# Patient Record
Sex: Female | Born: 1940 | Race: Black or African American | Hispanic: No | Smoking: Former smoker
Health system: Southern US, Community
[De-identification: ages and names within clinical notes are randomized; demographics above are authoritative.]

## PROBLEM LIST (undated history)

## (undated) DIAGNOSIS — E7800 Pure hypercholesterolemia, unspecified: Secondary | ICD-10-CM

## (undated) DIAGNOSIS — R42 Dizziness and giddiness: Secondary | ICD-10-CM

## (undated) DIAGNOSIS — I10 Essential (primary) hypertension: Secondary | ICD-10-CM

## (undated) DIAGNOSIS — Z972 Presence of dental prosthetic device (complete) (partial): Secondary | ICD-10-CM

## (undated) DIAGNOSIS — G459 Transient cerebral ischemic attack, unspecified: Secondary | ICD-10-CM

## (undated) DIAGNOSIS — Z9049 Acquired absence of other specified parts of digestive tract: Secondary | ICD-10-CM

## (undated) DIAGNOSIS — J449 Chronic obstructive pulmonary disease, unspecified: Secondary | ICD-10-CM

## (undated) HISTORY — PX: TONSILLECTOMY: SUR1361

## (undated) HISTORY — PX: BACK SURGERY: SHX140

## (undated) HISTORY — PX: ABDOMINAL HYSTERECTOMY: SHX81

## (undated) HISTORY — PX: TUBAL LIGATION: SHX77

---

## 2006-09-22 ENCOUNTER — Ambulatory Visit: Payer: Self-pay | Admitting: Emergency Medicine

## 2007-01-30 ENCOUNTER — Emergency Department: Payer: Self-pay | Admitting: Emergency Medicine

## 2007-02-01 ENCOUNTER — Ambulatory Visit: Payer: Self-pay | Admitting: Emergency Medicine

## 2007-02-11 ENCOUNTER — Ambulatory Visit: Payer: Self-pay | Admitting: Internal Medicine

## 2007-03-18 ENCOUNTER — Ambulatory Visit: Payer: Self-pay | Admitting: Internal Medicine

## 2008-10-23 ENCOUNTER — Ambulatory Visit: Payer: Self-pay | Admitting: Family Medicine

## 2008-12-11 ENCOUNTER — Ambulatory Visit: Payer: Self-pay | Admitting: Internal Medicine

## 2009-01-11 ENCOUNTER — Ambulatory Visit: Payer: Self-pay | Admitting: Family Medicine

## 2009-04-09 ENCOUNTER — Ambulatory Visit: Payer: Self-pay | Admitting: Internal Medicine

## 2010-05-05 ENCOUNTER — Ambulatory Visit: Payer: Self-pay | Admitting: Internal Medicine

## 2010-08-06 DIAGNOSIS — I6350 Cerebral infarction due to unspecified occlusion or stenosis of unspecified cerebral artery: Secondary | ICD-10-CM | POA: Insufficient documentation

## 2010-08-08 DIAGNOSIS — I5033 Acute on chronic diastolic (congestive) heart failure: Secondary | ICD-10-CM | POA: Insufficient documentation

## 2010-08-25 ENCOUNTER — Ambulatory Visit: Payer: Self-pay | Admitting: Internal Medicine

## 2010-09-16 ENCOUNTER — Ambulatory Visit: Payer: Self-pay

## 2011-04-03 ENCOUNTER — Ambulatory Visit: Payer: Self-pay

## 2011-04-03 ENCOUNTER — Emergency Department: Payer: Self-pay | Admitting: Emergency Medicine

## 2011-04-03 LAB — TROPONIN I: Troponin-I: 0.02 ng/mL

## 2011-04-03 LAB — CBC
HCT: 39.7 % (ref 35.0–47.0)
HGB: 12.6 g/dL (ref 12.0–16.0)
MCHC: 31.8 g/dL — ABNORMAL LOW (ref 32.0–36.0)
MCV: 84 fL (ref 80–100)
Platelet: 203 10*3/uL (ref 150–440)
RBC: 4.75 10*6/uL (ref 3.80–5.20)
RDW: 15.2 % — ABNORMAL HIGH (ref 11.5–14.5)

## 2011-04-03 LAB — BASIC METABOLIC PANEL
Anion Gap: 5 — ABNORMAL LOW (ref 7–16)
Calcium, Total: 8.7 mg/dL (ref 8.5–10.1)
Co2: 32 mmol/L (ref 21–32)
Creatinine: 0.81 mg/dL (ref 0.60–1.30)
EGFR (African American): >60
Osmolality: 281 (ref 275–301)
Potassium: 3.7 mmol/L (ref 3.5–5.1)

## 2011-06-02 ENCOUNTER — Ambulatory Visit: Payer: Self-pay | Admitting: Internal Medicine

## 2011-07-21 ENCOUNTER — Ambulatory Visit: Payer: Self-pay | Admitting: Internal Medicine

## 2011-07-21 LAB — CBC WITH DIFFERENTIAL/PLATELET
Basophil %: 0.3 %
Eosinophil %: 1.3 %
HGB: 12.7 g/dL (ref 12.0–16.0)
Lymphocyte #: 1.2 10*3/uL (ref 1.0–3.6)
MCH: 25.9 pg — ABNORMAL LOW (ref 26.0–34.0)
MCHC: 31.5 g/dL — ABNORMAL LOW (ref 32.0–36.0)
MCV: 82 fL (ref 80–100)
Monocyte %: 9.9 %
Neutrophil #: 5.6 10*3/uL (ref 1.4–6.5)
Neutrophil %: 73.3 %
RBC: 4.92 10*6/uL (ref 3.80–5.20)
RDW: 15.4 % — ABNORMAL HIGH (ref 11.5–14.5)
WBC: 7.6 10*3/uL (ref 3.6–11.0)

## 2011-07-21 LAB — URINALYSIS, COMPLETE
Bilirubin,UR: NEGATIVE
Blood: NEGATIVE
Ketone: NEGATIVE
Nitrite: NEGATIVE
Ph: 6 (ref 4.5–8.0)
Specific Gravity: 1.025 (ref 1.003–1.030)

## 2011-07-21 LAB — COMPREHENSIVE METABOLIC PANEL
Albumin: 3.6 g/dL (ref 3.4–5.0)
Alkaline Phosphatase: 112 U/L (ref 50–136)
BUN: 24 mg/dL — ABNORMAL HIGH (ref 7–18)
Bilirubin,Total: 0.4 mg/dL (ref 0.2–1.0)
EGFR (African American): 56 — ABNORMAL LOW
EGFR (Non-African Amer.): 48 — ABNORMAL LOW
Potassium: 3.5 mmol/L (ref 3.5–5.1)
SGPT (ALT): 44 U/L
Total Protein: 7.3 g/dL (ref 6.4–8.2)

## 2011-07-21 LAB — AMYLASE: Amylase: 130 U/L — ABNORMAL HIGH (ref 25–115)

## 2011-07-21 LAB — MAGNESIUM: Magnesium: 1.8 mg/dL

## 2011-07-23 LAB — URINE CULTURE

## 2011-08-01 ENCOUNTER — Ambulatory Visit: Payer: Self-pay | Admitting: Internal Medicine

## 2011-11-13 ENCOUNTER — Ambulatory Visit: Payer: Self-pay | Admitting: Internal Medicine

## 2012-03-09 ENCOUNTER — Ambulatory Visit: Payer: Self-pay

## 2012-07-29 ENCOUNTER — Ambulatory Visit: Payer: Self-pay | Admitting: Family Medicine

## 2012-08-13 ENCOUNTER — Ambulatory Visit: Payer: Self-pay | Admitting: Family Medicine

## 2012-10-17 DIAGNOSIS — M5430 Sciatica, unspecified side: Secondary | ICD-10-CM | POA: Insufficient documentation

## 2013-03-17 ENCOUNTER — Ambulatory Visit: Payer: Self-pay | Admitting: Physician Assistant

## 2013-10-22 DIAGNOSIS — I739 Peripheral vascular disease, unspecified: Secondary | ICD-10-CM | POA: Insufficient documentation

## 2014-06-12 DIAGNOSIS — M48061 Spinal stenosis, lumbar region without neurogenic claudication: Secondary | ICD-10-CM | POA: Insufficient documentation

## 2014-06-22 DIAGNOSIS — I10 Essential (primary) hypertension: Secondary | ICD-10-CM | POA: Insufficient documentation

## 2014-08-18 DIAGNOSIS — I340 Nonrheumatic mitral (valve) insufficiency: Secondary | ICD-10-CM | POA: Insufficient documentation

## 2014-10-15 ENCOUNTER — Other Ambulatory Visit: Payer: Self-pay | Admitting: Orthopedic Surgery

## 2014-10-15 DIAGNOSIS — M48061 Spinal stenosis, lumbar region without neurogenic claudication: Secondary | ICD-10-CM

## 2014-10-20 ENCOUNTER — Ambulatory Visit
Admission: RE | Admit: 2014-10-20 | Discharge: 2014-10-20 | Disposition: A | Discharge status: Home / Self Care | Payer: Medicare Other | Source: Ambulatory Visit | Attending: Orthopedic Surgery | Admitting: Orthopedic Surgery

## 2014-10-20 DIAGNOSIS — M48061 Spinal stenosis, lumbar region without neurogenic claudication: Secondary | ICD-10-CM

## 2014-10-20 DIAGNOSIS — M4806 Spinal stenosis, lumbar region: Secondary | ICD-10-CM | POA: Insufficient documentation

## 2014-10-28 DIAGNOSIS — M4316 Spondylolisthesis, lumbar region: Secondary | ICD-10-CM | POA: Insufficient documentation

## 2015-02-26 ENCOUNTER — Encounter: Payer: Self-pay | Admitting: Emergency Medicine

## 2015-02-26 ENCOUNTER — Ambulatory Visit
Admission: EM | Admit: 2015-02-26 | Discharge: 2015-02-26 | Disposition: A | Discharge status: Home / Self Care | Payer: Medicare Other | Attending: Family Medicine | Admitting: Family Medicine

## 2015-02-26 DIAGNOSIS — L299 Pruritus, unspecified: Secondary | ICD-10-CM

## 2015-02-26 HISTORY — DX: Pure hypercholesterolemia, unspecified: E78.00

## 2015-02-26 HISTORY — DX: Essential (primary) hypertension: I10

## 2015-02-26 NOTE — ED Provider Notes (Signed)
CSN: [id number]     Arrival date & time 02/26/15  1530 History   First MD Initiated Contact with Patient 02/26/15 1703     Chief Complaint  Patient presents with  . Pruritis   (Consider location/radiation/quality/duration/timing/severity/associated sxs/prior Treatment) HPI Comments: 75 yo female with a c/o 5 days of itching all over the body. Patient denies any fevers, wheezing, shortness of breath, chills, rash, new soaps, new detergents or cosmetics, exposures to plants or pets, new medications, new foods, etc. No known trigger.   The history is provided by the patient.    Past Medical History  Diagnosis Date  . Hypertension   . Hypercholesteremia    Past Surgical History  Procedure Laterality Date  . Abdominal hysterectomy    . Tonsillectomy    . Tubal ligation    . Back surgery     History reviewed. No pertinent family history. Social History  Substance Use Topics  . Smoking status: Never Smoker   . Smokeless tobacco: None  . Alcohol Use: No   OB History    No data available     Review of Systems  Allergies  Review of patient's allergies indicates no known allergies.  Home Medications   Prior to Admission medications   Medication Sig Start Date End Date Taking? Authorizing Provider  albuterol (PROVENTIL HFA;VENTOLIN HFA) 108 (90 Base) MCG/ACT inhaler Inhale 2 puffs into the lungs every 4 (four) hours as needed for wheezing or shortness of breath.   Yes Historical Provider, MD  aspirin 81 MG tablet Take 81 mg by mouth daily.   Yes Historical Provider, MD  atorvastatin (LIPITOR) 40 MG tablet Take 40 mg by mouth daily.   Yes Historical Provider, MD  calcium-vitamin D (OSCAL WITH D) 500-200 MG-UNIT tablet Take 1 tablet by mouth daily with breakfast.   Yes Historical Provider, MD  felodipine (PLENDIL) 2.5 MG 24 hr tablet Take 2.5 mg by mouth daily.   Yes Historical Provider, MD  fexofenadine (ALLEGRA) 180 MG tablet Take 180 mg by mouth daily.   Yes Historical  Provider, MD  fluticasone (FLONASE) 50 MCG/ACT nasal spray Place 2 sprays into both nostrils daily.   Yes Historical Provider, MD  lisinopril (PRINIVIL,ZESTRIL) 2.5 MG tablet Take 2.5 mg by mouth daily.   Yes Historical Provider, MD  omega-3 acid ethyl esters (LOVAZA) 1 g capsule Take 1 g by mouth daily.   Yes Historical Provider, MD  omeprazole (PRILOSEC) 40 MG capsule Take 40 mg by mouth daily.   Yes Historical Provider, MD  vitamin B-12 (CYANOCOBALAMIN) 1000 MCG tablet Take 1,000 mcg by mouth daily.   Yes Historical Provider, MD   Meds Ordered and Administered this Visit  Medications - No data to display  BP 142/68 mmHg  Pulse 64  Temp(Src) 97.6 F (36.4 C) (Tympanic)  Resp 16  Ht  (1.575 m)  Wt 169 lb (76.658 kg)  BMI 30.90 kg/m2  SpO2 100% No data found.   Physical Exam  Constitutional: She appears well-developed and well-nourished. No distress.  Cardiovascular: Normal rate and normal heart sounds.   Pulmonary/Chest: Effort normal and breath sounds normal. No respiratory distress. She has no wheezes.  Neurological: She is alert.  Skin: Skin is warm and dry. No rash noted. She is not diaphoretic. No erythema.  Nursing note and vitals reviewed.   ED Course  Procedures (including critical care time)  Labs Review Labs Reviewed - No data to display  Imaging Review No results found.   Visual Acuity  Review  Right Eye Distance:   Left Eye Distance:   Bilateral Distance:    Right Eye Near:   Left Eye Near:    Bilateral Near:         MDM   1. Pruritus   (unknown etiology; no rash)  1. Possible etiologies reviewed with patient and recommended checking blood tests (CMP, CBC) however patient refuses 3. Recommend supportive treatment with otc moisturizers and antihistamines 3. Follow-up prn if symptoms worsen or don't improve    Payton Mccallum, MD 02/26/15 [phone]

## 2015-02-26 NOTE — ED Notes (Signed)
Patient c/o itching all over for the past 5 days.  Patient states that itching is worse at night.

## 2015-04-07 ENCOUNTER — Ambulatory Visit
Admission: EM | Admit: 2015-04-07 | Discharge: 2015-04-07 | Disposition: A | Discharge status: Home / Self Care | Payer: Medicare Other | Attending: Family Medicine | Admitting: Family Medicine

## 2015-04-07 ENCOUNTER — Encounter: Payer: Self-pay | Admitting: *Deleted

## 2015-04-07 DIAGNOSIS — N390 Urinary tract infection, site not specified: Secondary | ICD-10-CM

## 2015-04-07 DIAGNOSIS — J069 Acute upper respiratory infection, unspecified: Secondary | ICD-10-CM

## 2015-04-07 HISTORY — DX: Chronic obstructive pulmonary disease, unspecified: J44.9

## 2015-04-07 LAB — URINALYSIS COMPLETE WITH MICROSCOPIC (ARMC ONLY)
Bacteria, UA: NONE SEEN
RBC / HPF: NONE SEEN RBC/hpf (ref 0–5)

## 2015-04-07 MED ORDER — LEVOFLOXACIN 500 MG PO TABS: 500.0000 mg | ORAL_TABLET | Freq: Every day | ORAL | Status: DC

## 2015-04-07 NOTE — ED Notes (Signed)
Productive cough- clear, x1 week. Denies other respiratory symptoms. Recently dx with UTI and feels the meds rx did not completely resolve the problem.

## 2015-04-07 NOTE — ED Provider Notes (Signed)
CSN: [id number]     Arrival date & time 04/07/15  0857 History   First MD Initiated Contact with Patient 04/07/15 1014     Chief Complaint  Patient presents with  . Cough  . Dysuria   (Consider location/radiation/quality/duration/timing/severity/associated sxs/prior Treatment) HPI: Patient presents today with symptoms of mild productive cough. She also states that she has had burning with urination and abdominal discomfort. She denies any chest pain, shortness of breath, severe headache, nausea, vomiting, diarrhea. She was treated for UTI back in January 2017. She admits that she got better and now her symptoms have returned. She denies any flank pain. She admits that her vitamins do change the color of her urine.  She admits to diagnosis of asthma and has been using her albuterol inhaler once to twice daily for the last week. Unsure about COPD but has hx of secondhand smoke.   Past Medical History  Diagnosis Date  . Hypertension   . Hypercholesteremia   . COPD (chronic obstructive pulmonary disease) [facility])    Past Surgical History  Procedure Laterality Date  . Abdominal hysterectomy    . Tonsillectomy    . Tubal ligation    . Back surgery     History reviewed. No pertinent family history. Social History  Substance Use Topics  . Smoking status: Former Games developer  . Smokeless tobacco: None  . Alcohol Use: No   OB History    No data available     Review of Systems: Negative except mentioned above.   Allergies  Review of patient's allergies indicates no known allergies.  Home Medications   Prior to Admission medications   Medication Sig Start Date End Date Taking? Authorizing Provider  albuterol (PROVENTIL HFA;VENTOLIN HFA) 108 (90 Base) MCG/ACT inhaler Inhale 2 puffs into the lungs every 4 (four) hours as needed for wheezing or shortness of breath.   Yes Historical Provider, MD  aspirin 81 MG tablet Take 81 mg by mouth daily.   Yes Historical Provider, MD  atorvastatin (LIPITOR)  40 MG tablet Take 40 mg by mouth daily.   Yes Historical Provider, MD  calcium-vitamin D (OSCAL WITH D) 500-200 MG-UNIT tablet Take 1 tablet by mouth daily with breakfast.   Yes Historical Provider, MD  felodipine (PLENDIL) 2.5 MG 24 hr tablet Take 2.5 mg by mouth daily.   Yes Historical Provider, MD  fexofenadine (ALLEGRA) 180 MG tablet Take 180 mg by mouth daily.   Yes Historical Provider, MD  fluticasone (FLONASE) 50 MCG/ACT nasal spray Place 2 sprays into both nostrils daily.   Yes Historical Provider, MD  lisinopril (PRINIVIL,ZESTRIL) 2.5 MG tablet Take 2.5 mg by mouth daily.   Yes Historical Provider, MD  omega-3 acid ethyl esters (LOVAZA) 1 g capsule Take 1 g by mouth daily.   Yes Historical Provider, MD  omeprazole (PRILOSEC) 40 MG capsule Take 40 mg by mouth daily.   Yes Historical Provider, MD  vitamin B-12 (CYANOCOBALAMIN) 1000 MCG tablet Take 1,000 mcg by mouth daily.   Yes Historical Provider, MD  levofloxacin (LEVAQUIN) 500 MG tablet Take 1 tablet (500 mg total) by mouth daily. 04/07/15   Jolene Provost, MD   Meds Ordered and Administered this Visit  Medications - No data to display  BP 139/70 mmHg  Pulse 73  Temp(Src) 98.4 F (36.9 C) (Oral)  Resp 16  Ht  (1.575 m)  Wt 170 lb (77.111 kg)  BMI 31.09 kg/m2  SpO2 100% No data found.   Physical Exam   GENERAL:  NAD HEENT: no pharyngeal erythema, no exudate, no erythema of TMs, no cervical LAD RESP: CTA B CARD: RRR, + systolic murmur   ABD: +BS, mild suprapubic tenderness, no rebound or guarding, no flank tenderness  NEURO: AAO  ED Course  Procedures (including critical care time)  Labs Review Labs Reviewed  URINALYSIS COMPLETEWITH MICROSCOPIC ([facility] ONLY) - Abnormal; Notable for the following:    Color, Urine ORANGE (*)    Glucose, UA   (*)    Value: TEST NOT REPORTED DUE TO COLOR INTERFERENCE OF URINE PIGMENT   Bilirubin Urine   (*)    Value: TEST NOT REPORTED DUE TO COLOR INTERFERENCE OF URINE PIGMENT    Ketones, ur   (*)    Value: TEST NOT REPORTED DUE TO COLOR INTERFERENCE OF URINE PIGMENT   Hgb urine dipstick   (*)    Value: TEST NOT REPORTED DUE TO COLOR INTERFERENCE OF URINE PIGMENT   Protein, ur   (*)    Value: TEST NOT REPORTED DUE TO COLOR INTERFERENCE OF URINE PIGMENT   Nitrite   (*)    Value: TEST NOT REPORTED DUE TO COLOR INTERFERENCE OF URINE PIGMENT   Leukocytes, UA   (*)    Value: TEST NOT REPORTED DUE TO COLOR INTERFERENCE OF URINE PIGMENT   Squamous Epithelial / LPF 0-5 (*)    All other components within normal limits  URINE CULTURE    Imaging Review No results found.    MDM   1. Urinary tract infection without hematuria, site unspecified   2. URI (upper respiratory infection)    Given patient's symptoms will treat patient with Levaquin for 5 days. Will send urine for culture. Discussed with patient the calcium oxalate has been seen on urine analysis she has been told she has had this on a previous occasion as well. There does not appear to be any symptoms suggestive of any problems of nephrolithiasis at this time. She will speak to her primary care physician regarding this at her next visit. Spoken to the patient about taking any over-the-counter cough suppressant. She can continue her allergy medication that she has been taking. I have encouraged her to write down the different vitamins that she is taking so she can share that with her physician. If she has any worsening of her symptoms she is to seek medical attention as discussed.    Jolene Provost, MD 04/07/15 1134

## 2015-04-09 LAB — URINE CULTURE: SPECIAL REQUESTS: NORMAL

## 2015-04-12 ENCOUNTER — Encounter: Payer: Self-pay | Admitting: *Deleted

## 2015-04-12 ENCOUNTER — Ambulatory Visit
Admission: EM | Admit: 2015-04-12 | Discharge: 2015-04-12 | Disposition: A | Discharge status: Home / Self Care | Payer: Medicare Other | Attending: Family Medicine | Admitting: Family Medicine

## 2015-04-12 DIAGNOSIS — J449 Chronic obstructive pulmonary disease, unspecified: Secondary | ICD-10-CM

## 2015-04-12 DIAGNOSIS — R05 Cough: Secondary | ICD-10-CM

## 2015-04-12 DIAGNOSIS — R300 Dysuria: Secondary | ICD-10-CM | POA: Diagnosis not present

## 2015-04-12 DIAGNOSIS — R059 Cough, unspecified: Secondary | ICD-10-CM

## 2015-04-12 LAB — URINALYSIS COMPLETE WITH MICROSCOPIC (ARMC ONLY)
BILIRUBIN URINE: NEGATIVE
GLUCOSE, UA: NEGATIVE mg/dL
Ketones, ur: NEGATIVE mg/dL
Leukocytes, UA: NEGATIVE
Nitrite: NEGATIVE
PH: 6 (ref 5.0–8.0)
Protein, ur: NEGATIVE mg/dL
Specific Gravity, Urine: 1.02 (ref 1.005–1.030)

## 2015-04-12 MED ORDER — GUAIFENESIN-CODEINE 100-10 MG/5ML PO SOLN: 10.0000 mL | Freq: Four times a day (QID) | ORAL | Status: DC | PRN

## 2015-04-12 MED ORDER — ALBUTEROL SULFATE HFA 108 (90 BASE) MCG/ACT IN AERS: 1.0000 | INHALATION_SPRAY | Freq: Four times a day (QID) | RESPIRATORY_TRACT | Status: DC | PRN

## 2015-04-12 NOTE — ED Provider Notes (Signed)
CSN: [id number]     Arrival date & time 04/12/15  1053 History   First MD Initiated Contact with Patient 04/12/15 1142     Chief Complaint  Patient presents with  . Cough  . Dysuria   (Consider location/radiation/quality/duration/timing/severity/associated sxs/prior Treatment) HPI Comments: 75 yo female with a c/o cough and dysuria for 2 weeks. Patient was seen last week here for the same and prescribed Levaquin which she completed. States has improved but symptoms have not resolved completely. Denies any fevers, chills, chest pains, shortness of breath, hematuria. Patient also requesting an rx for albuterol to use prn (normally rx'ed by PCP, however patient states she ran out).   Patient is a 75 y.o. female presenting with cough and dysuria. The history is provided by the patient.  Cough Dysuria   Past Medical History  Diagnosis Date  . Hypertension   . Hypercholesteremia   . COPD (chronic obstructive pulmonary disease) [facility])    Past Surgical History  Procedure Laterality Date  . Abdominal hysterectomy    . Tonsillectomy    . Tubal ligation    . Back surgery     History reviewed. No pertinent family history. Social History  Substance Use Topics  . Smoking status: Former Games developer  . Smokeless tobacco: None  . Alcohol Use: No   OB History    No data available     Review of Systems  Respiratory: Positive for cough.   Genitourinary: Positive for dysuria.    Allergies  Review of patient's allergies indicates no known allergies.  Home Medications   Prior to Admission medications   Medication Sig Start Date End Date Taking? Authorizing Provider  aspirin 81 MG tablet Take 81 mg by mouth daily.   Yes Historical Provider, MD  atorvastatin (LIPITOR) 40 MG tablet Take 40 mg by mouth daily.   Yes Historical Provider, MD  calcium-vitamin D (OSCAL WITH D) 500-200 MG-UNIT tablet Take 1 tablet by mouth daily with breakfast.   Yes Historical Provider, MD  felodipine (PLENDIL) 2.5  MG 24 hr tablet Take 2.5 mg by mouth daily.   Yes Historical Provider, MD  fexofenadine (ALLEGRA) 180 MG tablet Take 180 mg by mouth daily.   Yes Historical Provider, MD  fluticasone (FLONASE) 50 MCG/ACT nasal spray Place 2 sprays into both nostrils daily.   Yes Historical Provider, MD  lisinopril (PRINIVIL,ZESTRIL) 2.5 MG tablet Take 2.5 mg by mouth daily.   Yes Historical Provider, MD  omega-3 acid ethyl esters (LOVAZA) 1 g capsule Take 1 g by mouth daily.   Yes Historical Provider, MD  omeprazole (PRILOSEC) 40 MG capsule Take 40 mg by mouth daily.   Yes Historical Provider, MD  vitamin B-12 (CYANOCOBALAMIN) 1000 MCG tablet Take 1,000 mcg by mouth daily.   Yes Historical Provider, MD  albuterol (PROVENTIL HFA;VENTOLIN HFA) 108 (90 Base) MCG/ACT inhaler Inhale 1-2 puffs into the lungs every 6 (six) hours as needed for wheezing or shortness of breath. 04/12/15   Payton Mccallum, MD  guaiFENesin-codeine 100-10 MG/5ML syrup Take 10 mLs by mouth every 6 (six) hours as needed for cough. 04/12/15   Payton Mccallum, MD  levofloxacin (LEVAQUIN) 500 MG tablet Take 1 tablet (500 mg total) by mouth daily. 04/07/15   Jolene Provost, MD   Meds Ordered and Administered this Visit  Medications - No data to display  BP 148/90 mmHg  Pulse 80  Temp(Src) 98.3 F (36.8 C) (Oral)  Resp 16  Ht  (1.575 m)  Wt 170 lb (77.111  kg)  BMI 31.09 kg/m2  SpO2 100% No data found.   Physical Exam  Constitutional: She appears well-developed and well-nourished. No distress.  HENT:  Head: Normocephalic.  Right Ear: Tympanic membrane, external ear and ear canal normal.  Left Ear: Tympanic membrane, external ear and ear canal normal.  Nose: Rhinorrhea present.  Mouth/Throat: Uvula is midline, oropharynx is clear and moist and mucous membranes are normal.  Eyes: Conjunctivae and EOM are normal. Pupils are equal, round, and reactive to light. Right eye exhibits no discharge. Left eye exhibits no discharge. No scleral icterus.   Neck: Normal range of motion. Neck supple. No JVD present. No tracheal deviation present. No thyromegaly present.  Cardiovascular: Normal rate, regular rhythm, normal heart sounds and intact distal pulses.   No murmur heard. Pulmonary/Chest: Effort normal and breath sounds normal. No stridor. No respiratory distress. She has no wheezes. She has no rales. She exhibits no tenderness.  Abdominal: Soft. Bowel sounds are normal. She exhibits no distension and no mass. There is no tenderness. There is no rebound and no guarding.  Lymphadenopathy:    She has no cervical adenopathy.  Neurological: She is alert.  Skin: She is not diaphoretic.  Vitals reviewed.   ED Course  Procedures (including critical care time)  Labs Review Labs Reviewed  URINALYSIS COMPLETEWITH MICROSCOPIC ([facility] ONLY) - Abnormal; Notable for the following:    Hgb urine dipstick TRACE (*)    Bacteria, UA RARE (*)    Squamous Epithelial / LPF 0-5 (*)    All other components within normal limits  URINE CULTURE    Imaging Review No results found.   Visual Acuity Review  Right Eye Distance:   Left Eye Distance:   Bilateral Distance:    Right Eye Near:   Left Eye Near:    Bilateral Near:         MDM   1. Dysuria   2. Chronic obstructive pulmonary disease, unspecified COPD type (HCC)   3. Cough    Discharge Medication List as of 04/12/2015 12:41 PM    START taking these medications   Details  guaiFENesin-codeine 100-10 MG/5ML syrup Take 10 mLs by mouth every 6 (six) hours as needed for cough., Starting 04/12/2015, Until Discontinued, Print       1. Labs/x-ray results and diagnosis reviewed with patient/parent/guardian/family 2. rx as per orders above; reviewed possible side effects, interactions, risks and benefits; rx also sent for albuterol inhaler per patient request since she had run out 3. Recommend supportive treatment with increased water intake 4. Follow-up prn if symptoms worsen or don't  improve    Payton Mccallumrlando Anila Bojarski, MD 04/12/15 [phone]

## 2015-04-12 NOTE — ED Notes (Signed)
Productive cough- clear, and dysuria. Cough x 2 weeks. Dysuria x3 weeks. Seen here last week for same.

## 2015-04-13 DIAGNOSIS — I6523 Occlusion and stenosis of bilateral carotid arteries: Secondary | ICD-10-CM | POA: Insufficient documentation

## 2015-04-14 LAB — URINE CULTURE
CULTURE: NO GROWTH
SPECIAL REQUESTS: NORMAL

## 2015-05-11 ENCOUNTER — Encounter: Payer: Self-pay | Admitting: Emergency Medicine

## 2015-05-11 ENCOUNTER — Ambulatory Visit (INDEPENDENT_AMBULATORY_CARE_PROVIDER_SITE_OTHER): Payer: Medicare Other

## 2015-05-11 ENCOUNTER — Ambulatory Visit
Admission: EM | Admit: 2015-05-11 | Discharge: 2015-05-11 | Disposition: A | Discharge status: Home / Self Care | Payer: Medicare Other | Attending: Family Medicine | Admitting: Family Medicine

## 2015-05-11 DIAGNOSIS — J40 Bronchitis, not specified as acute or chronic: Secondary | ICD-10-CM

## 2015-05-11 MED ORDER — DOXYCYCLINE HYCLATE 100 MG PO CAPS: 100.0000 mg | ORAL_CAPSULE | Freq: Two times a day (BID) | ORAL | Status: DC

## 2015-05-11 MED ORDER — BENZONATATE 100 MG PO CAPS: 100.0000 mg | ORAL_CAPSULE | Freq: Three times a day (TID) | ORAL | Status: DC | PRN

## 2015-05-11 MED ORDER — HYDROCOD POLST-CPM POLST ER 10-8 MG/5ML PO SUER: 5.0000 mL | Freq: Every evening | ORAL | Status: AC | PRN

## 2015-05-11 NOTE — Discharge Instructions (Signed)
Take medication as prescribed. Rest. Eat and drink regularly. Use home albuterol inhaler as needed.   Follow up with your primary care physician this week.   Return to Urgent care or ER for chest pain, shortness of breath, weakness, persistent cough, new or worsening concerns.

## 2015-05-11 NOTE — ED Notes (Signed)
Patient c/o cough and chest congestion for a month.   

## 2015-05-11 NOTE — ED Provider Notes (Signed)
[facility]  ____________________________________________  Time seen: Approximately 11:29 AM  I have reviewed the triage vital signs and the nursing notes.   HISTORY Chief Complaint Cough   HPI Natasha Walker is a 75 y.o. female presents for complaints of one month of cough and chest congestion.  States at the beginning of being sick she had nasal congestion and nasal drainage as well as reports that has since resolved. Patient states that she only has cough at this time. Patient states that the cough is during the day as well as at night. Patient states that she can occasionally feel drainage in the back of her throat causing her cough to increase. Patient states that she does frequently cough up some "slimy mucus". States that cough at night is usually more productive. Patient denies any fever or chest pain or shortness of breath. Reports continues to eat and drink well. Patient reports that she has had a history of similar in the past and states that she thinks she was told was bronchitis. Patient reports that she does occasionally hear herself wheezing. Patient states that wheezing is primarily at night. Denies any persistent wheezing or shortness of breath with wheezing.   Patient reports that she does have chronic history of issues with allergies as well as coughing. Patient states that these symptoms have not been as bad the last 15-20 years and states that she thinks it was because her husband who was a chronic smoker had passed away.States occasional seasonal allergies but states this does not seem like her allergies.   Patient persists she was seen here for the same approximate 3 weeks ago and was treated with liquid cough syrup which did help her cough some. Patient also reports that she was given an albuterol inhaler which has helped some as well.  Denies chest pain, shortness of breath, chest pain with deep breath, extremity pain, extremity swelling, neck pain, dysuria,  fevers, rash, weakness or dizziness. Reports history of chronic back pain and states that her back pain has been unchanged from her baseline.  PCP:  [facility]    Past Medical History  Diagnosis Date  . Hypertension   . Hypercholesteremia   . COPD (chronic obstructive pulmonary disease) (HCC)   passive smoke exposure  There are no active problems to display for this patient.   Past Surgical History  Procedure Laterality Date  . Abdominal hysterectomy    . Tonsillectomy    . Tubal ligation    . Back surgery      Current Outpatient Rx  Name  Route  Sig  Dispense  Refill  . albuterol (PROVENTIL HFA;VENTOLIN HFA) 108 (90 Base) MCG/ACT inhaler   Inhalation   Inhale 1-2 puffs into the lungs every 6 (six) hours as needed for wheezing or shortness of breath.   1 Inhaler   0   . aspirin 81 MG tablet   Oral   Take 81 mg by mouth daily.         Marland Kitchen. atorvastatin (LIPITOR) 40 MG tablet   Oral   Take 40 mg by mouth daily.         . calcium-vitamin D (OSCAL WITH D) 500-200 MG-UNIT tablet   Oral   Take 1 tablet by mouth daily with breakfast.         . felodipine (PLENDIL) 2.5 MG 24 hr tablet   Oral   Take 2.5 mg by mouth daily.         . fexofenadine (ALLEGRA) 180  MG tablet   Oral   Take 180 mg by mouth daily.         . fluticasone (FLONASE) 50 MCG/ACT nasal spray   Each Nare   Place 2 sprays into both nostrils daily.                               Marland Kitchen lisinopril (PRINIVIL,ZESTRIL) 2.5 MG tablet   Oral   Take 2.5 mg by mouth daily.         Marland Kitchen omega-3 acid ethyl esters (LOVAZA) 1 g capsule   Oral   Take 1 g by mouth daily.         Marland Kitchen omeprazole (PRILOSEC) 40 MG capsule   Oral   Take 40 mg by mouth daily.         . vitamin B-12 (CYANOCOBALAMIN) 1000 MCG tablet   Oral   Take 1,000 mcg by mouth daily.           Allergies Review of patient's allergies indicates no known allergies.  History reviewed. No pertinent family history.  Social  History Social History  Substance Use Topics  . Smoking status: Former Games developer  . Smokeless tobacco: None  . Alcohol Use: No    Review of Systems Constitutional: No fever/chills Eyes: No visual changes. ENT: No sore throat. Positive cough.  Cardiovascular: Denies chest pain. Respiratory: Denies shortness of breath. Gastrointestinal: No abdominal pain.  No nausea, no vomiting.  No diarrhea.  No constipation. Genitourinary: Negative for dysuria. Musculoskeletal: Negative for back pain. Skin: Negative for rash. Neurological: Negative for headaches, focal weakness or numbness.  10-point ROS otherwise negative.  ____________________________________________   PHYSICAL EXAM:  VITAL SIGNS: ED Triage Vitals  Enc Vitals Group     BP 05/11/15 1028 142/79 mmHg     Pulse Rate 05/11/15 1028 66     Resp 05/11/15 1028 16     Temp 05/11/15 1028 97 F (36.1 C)     Temp Source 05/11/15 1028 Tympanic     SpO2 05/11/15 1028 99 %     Weight 05/11/15 1028 169 lb (76.658 kg)     Height 05/11/15 1028  (1.575 m)     Head Cir --      Peak Flow --      Pain Score 05/11/15 1030 0     Pain Loc --      Pain Edu? --      Excl. in GC? --    Constitutional: Alert and oriented. Well appearing and in no acute distress. Eyes: Conjunctivae are normal. PERRL. EOMI. Head: Atraumatic. Mild frontal sinus bilaterally tenderness to palpation. No maxillary sinus tenderness to palpation bilaterally. No swelling. No erythema.  Ears: no erythema, normal TMs bilaterally.   Nose: Mild nasal congestion with clear rhinorrhea.  Mouth/Throat: Mucous membranes are moist. No pharyngeal erythema. No tonsillar swelling or exudate.  Neck: No stridor.  No cervical spine tenderness to palpation. Hematological/Lymphatic/Immunilogical: No cervical lymphadenopathy. Cardiovascular: Normal rate, regular rhythm. Grossly normal heart sounds.  Good peripheral circulation. Respiratory: Normal respiratory effort.  No  retractions. Lungs CTAB.No wheezes, rales or rhonchi. Good air movement. Speaks in complete sentences. Ambulatory in room while speaking in complete sentences. Gastrointestinal: Soft and nontender. Normal Bowel sounds. No CVA tenderness. Musculoskeletal: No lower or upper extremity tenderness nor edema. Bilateral calfs nontender. Bilateral distal pedal pulses equal and easily palpable. No cervical, thoracic or lumbar tenderness to palpation. Neurologic:  Normal speech  and language. No gross focal neurologic deficits are appreciated. No gait instability. Skin:  Skin is warm, dry and intact. No rash noted. Psychiatric: Mood and affect are normal. Speech and behavior are normal.  ____________________________________________   LABS (all labs ordered are listed, but only abnormal results are displayed)  Labs Reviewed - No data to display ____________________________________________  RADIOLOGY   EXAM: CHEST 2 VIEW  COMPARISON: Chest x-ray of 11/13/2011  FINDINGS: No focal infiltrate or effusion is seen. Mild linear scarring remains at the left lung base. Mediastinal and hilar contours are unchanged and cardiomegaly is stable. No acute bony abnormality is seen. Hardware for fusion of the lumbar spine is present.  IMPRESSION: 1. No active lung disease. Stable scarring at the left lung base. 2. Stable cardiomegaly.   Electronically Signed By: Dwyane Dee M.D. On: 05/11/2015 12:19      I, Renford Dills, personally viewed and evaluated these images (plain radiographs) as part of my medical decision making, as well as reviewing the written report by the radiologist.   INITIAL IMPRESSION / ASSESSMENT AND PLAN / ED COURSE  Pertinent labs & imaging results that were available during my care of the patient were reviewed by me and considered in my medical decision making (see chart for details).  Very well-appearing patient. No acute distress. Presents for the complaints of  3-4 weeks of cough. Patient reports that initially she had congestion that was present as well and states the congestion has since resolved. Patient reports that she was initially treated with oral Levaquin but the time she had a UTI as well. Patient was then seen a week later and was treated with oral codeine with guaifenesin and albuterol inhaler for cough. Patient does have dry intermittent cough noted in room. No wheezes, rales or rhonchi. Good air movement. Speaks in complete sentences. Moist mucous membranes. Abdomen soft and nontender. Suspect bronchitis. However with patient with history of COPD as well as persistent cough will evaluate chest x-ray. Patient denies other accompanying symptoms.  Chest xray no active lung disease, stable scarring at the left lung base, and stable cardiomegaly per radiologist. Suspect bronchitis. Will treat patient with oral doxycycline, when necessary Tessalon Perles and when necessary Tussionex at night as needed. Patient reports that she has had Tussionex in the past and tolerated it well. Encouraged rest, eating and drinking regularly. Encouraged close primary care physician follow-up.  Discussed follow up with Primary care physician this week. Discussed follow up and return parameters including no resolution or any worsening concerns. Patient verbalized understanding and agreed to plan.   ____________________________________________   FINAL CLINICAL IMPRESSION(S) / ED DIAGNOSES  Final diagnoses:  Bronchitis      Note: This dictation was prepared with Dragon dictation along with smaller phrase technology. Any transcriptional errors that result from this process are unintentional.    Renford Dills, NP 05/11/15 1601

## 2015-07-08 ENCOUNTER — Ambulatory Visit
Admission: EM | Admit: 2015-07-08 | Discharge: 2015-07-08 | Disposition: A | Discharge status: Home / Self Care | Payer: Medicare Other | Attending: Family Medicine | Admitting: Family Medicine

## 2015-07-08 ENCOUNTER — Ambulatory Visit (INDEPENDENT_AMBULATORY_CARE_PROVIDER_SITE_OTHER): Payer: Medicare Other

## 2015-07-08 DIAGNOSIS — Z8709 Personal history of other diseases of the respiratory system: Secondary | ICD-10-CM | POA: Diagnosis not present

## 2015-07-08 DIAGNOSIS — J209 Acute bronchitis, unspecified: Secondary | ICD-10-CM

## 2015-07-08 DIAGNOSIS — J40 Bronchitis, not specified as acute or chronic: Secondary | ICD-10-CM

## 2015-07-08 MED ORDER — TIOTROPIUM BROMIDE MONOHYDRATE 18 MCG IN CAPS: 18.0000 ug | ORAL_CAPSULE | RESPIRATORY_TRACT | Status: DC

## 2015-07-08 MED ORDER — PREDNISONE 10 MG (21) PO TBPK: ORAL_TABLET | ORAL | Status: DC

## 2015-07-08 MED ORDER — HYDROCOD POLST-CPM POLST ER 10-8 MG/5ML PO SUER: 5.0000 mL | Freq: Two times a day (BID) | ORAL | Status: DC | PRN

## 2015-07-08 MED ORDER — LEVOFLOXACIN 500 MG PO TABS: 500.0000 mg | ORAL_TABLET | Freq: Every day | ORAL | Status: DC

## 2015-07-08 NOTE — Discharge Instructions (Signed)

## 2015-07-08 NOTE — ED Provider Notes (Signed)
CSN: [id number]     Arrival date & time 07/08/15  1234 History   First MD Initiated Contact with Patient 07/08/15 1323     Nurses notes were reviewed. Chief Complaint  Patient presents with  . Cough    cough since April. Treated x 2 for same but it doesn't seem to resolve. Tight cough productive of "clear slimy phlegm" worse at night. Denies other sx    Patient states that she's had this cough since April 1 week of April. She's been treated at least twice for bronchitis in that time. She states that she never d was completely well. She is placed on Zithromax which didn't do much for her. She's had a history of recurrent bronchitis and in reviewing her chart this diagnosis COPD. She states she does not smoke but her husband who died about 17 years ago did smoke. In reviewing her chart she used to be a former smoker and she does have a history of COPD the last chest x-ray she had was April 4. I've informed her that she probably doesn't have a repeat chest x-ray since it's been now almost 2 months since her last one and she is reports symptoms never really cleared. She wasn't too happy about that. She has had to take prednisone before in the past have informed her that we may need to put her back on prednisone. She does have an albuterol inhaler which she recently had filled.  She denies any fever the last 2 months. She's had a tonsillectomy back surgery and abdominal hysterectomy. She has history of hypertension hyperlipidemia along with a COPD. No known drug allergies. No pertinent family medical history revelent to this visit.   (Consider location/radiation/quality/duration/timing/severity/associated sxs/prior Treatment) Patient is a 75 y.o. female presenting with cough. The history is provided by the patient. No language interpreter was used.  Cough Cough characteristics:  Productive Sputum characteristics:  White and frothy Severity:  Moderate Onset quality:  Unable to specify Duration:  8  weeks Timing:  Constant Progression:  Worsening Chronicity:  Recurrent Context: upper respiratory infection   Context: not animal exposure, not exposure to allergens and not occupational exposure   Relieved by:  Nothing Ineffective treatments:  Beta-agonist inhaler Associated symptoms: wheezing   Associated symptoms: no rash, no rhinorrhea, no shortness of breath, no sinus congestion and no sore throat   Risk factors: no recent infection     Past Medical History  Diagnosis Date  . Hypertension   . Hypercholesteremia   . COPD (chronic obstructive pulmonary disease) [facility])    Past Surgical History  Procedure Laterality Date  . Abdominal hysterectomy    . Tonsillectomy    . Tubal ligation    . Back surgery     History reviewed. No pertinent family history. Social History  Substance Use Topics  . Smoking status: Former Games developer  . Smokeless tobacco: None  . Alcohol Use: No   OB History    No data available     Review of Systems  HENT: Negative for rhinorrhea and sore throat.   Respiratory: Positive for cough and wheezing. Negative for shortness of breath.   Skin: Negative for rash.  All other systems reviewed and are negative.   Allergies  Review of patient's allergies indicates no known allergies.  Home Medications   Prior to Admission medications   Medication Sig Start Date End Date Taking? Authorizing Provider  albuterol (PROVENTIL HFA;VENTOLIN HFA) 108 (90 Base) MCG/ACT inhaler Inhale 1-2 puffs into the lungs every  6 (six) hours as needed for wheezing or shortness of breath. 04/12/15  Yes Payton Mccallum, MD  aspirin 81 MG tablet Take 81 mg by mouth daily.   Yes Historical Provider, MD  atorvastatin (LIPITOR) 40 MG tablet Take 40 mg by mouth daily.   Yes Historical Provider, MD  calcium-vitamin D (OSCAL WITH D) 500-200 MG-UNIT tablet Take 1 tablet by mouth daily with breakfast.   Yes Historical Provider, MD  felodipine (PLENDIL) 2.5 MG 24 hr tablet Take 2.5 mg by mouth  daily.   Yes Historical Provider, MD  fexofenadine (ALLEGRA) 180 MG tablet Take 180 mg by mouth daily.   Yes Historical Provider, MD  fluticasone (FLONASE) 50 MCG/ACT nasal spray Place 2 sprays into both nostrils daily.   Yes Historical Provider, MD  lisinopril (PRINIVIL,ZESTRIL) 2.5 MG tablet Take 2.5 mg by mouth daily.   Yes Historical Provider, MD  omega-3 acid ethyl esters (LOVAZA) 1 g capsule Take 1 g by mouth daily.   Yes Historical Provider, MD  omeprazole (PRILOSEC) 40 MG capsule Take 40 mg by mouth daily.   Yes Historical Provider, MD  vitamin B-12 (CYANOCOBALAMIN) 1000 MCG tablet Take 1,000 mcg by mouth daily.   Yes Historical Provider, MD  benzonatate (TESSALON PERLES) 100 MG capsule Take 1 capsule (100 mg total) by mouth 3 (three) times daily as needed for cough. 05/11/15   Renford Dills, NP  chlorpheniramine-HYDROcodone [facility] PENNKINETIC ER) 10-8 MG/5ML SUER Take 5 mLs by mouth every 12 (twelve) hours as needed for cough. 07/08/15   Hassan Rowan, MD  doxycycline (VIBRAMYCIN) 100 MG capsule Take 1 capsule (100 mg total) by mouth 2 (two) times daily. 05/11/15   Renford Dills, NP  guaiFENesin-codeine 100-10 MG/5ML syrup Take 10 mLs by mouth every 6 (six) hours as needed for cough. 04/12/15   Payton Mccallum, MD  levofloxacin (LEVAQUIN) 500 MG tablet Take 1 tablet (500 mg total) by mouth daily. 04/07/15   Jolene Provost, MD  levofloxacin (LEVAQUIN) 500 MG tablet Take 1 tablet (500 mg total) by mouth daily. 07/08/15   Hassan Rowan, MD  predniSONE (STERAPRED UNI-PAK 21 TAB) 10 MG (21) TBPK tablet Sig 6 tablet day 1, 5 tablets day 2, 4 tablets day 3,,3tablets day 4, 2 tablets day 5, 1 tablet day 6 take all tablets orally 07/08/15   Hassan Rowan, MD  tiotropium (SPIRIVA HANDIHALER) 18 MCG inhalation capsule Place 1 capsule (18 mcg total) into inhaler and inhale 1 day or 1 dose. 07/08/15   Hassan Rowan, MD   Meds Ordered and Administered this Visit  Medications - No data to display  BP 147/88 mmHg  Pulse 100   Temp(Src) 98 F (36.7 C) (Oral)  Resp 24  Ht  (1.575 m)  Wt 170 lb (77.111 kg)  BMI 31.09 kg/m2  SpO2 100% No data found.   Physical Exam  Constitutional: She is oriented to person, place, and time. She appears well-developed and well-nourished.  HENT:  Head: Normocephalic and atraumatic.  Right Ear: External ear normal.  Left Ear: External ear normal.  Eyes: Conjunctivae are normal. Pupils are equal, round, and reactive to light.  Neck: Normal range of motion. Neck supple.  Cardiovascular: Normal rate and regular rhythm.   Murmur heard.  Systolic murmur is present with a grade of 3/6  Pulmonary/Chest: Effort normal. No respiratory distress. She has no wheezes.  Musculoskeletal: Normal range of motion. She exhibits no tenderness.  Neurological: She is oriented to person, place, and time.  Skin: Skin is warm  and dry.  Psychiatric: She has a normal mood and affect.  Vitals reviewed.   ED Course  Procedures (including critical care time)  Labs Review Labs Reviewed - No data to display  Imaging Review Dg Chest 2 View  07/08/2015  CLINICAL DATA:  Persistent cough for 2 months following antibiotic treatment, general malaise EXAM: CHEST  2 VIEW COMPARISON:  Chest x-ray of 05/11/2015 FINDINGS: No active infiltrate or effusion is seen. Mediastinal and hilar contours are unremarkable. The heart is mildly enlarged. The descending thoracic aorta is tortuous. No bony abnormality is seen. Hardware for fusion of the lower lumbar spine is noted. IMPRESSION: Stable mild cardiomegaly.  No active lung disease. Electronically Signed   By: Dwyane DeePaul  Barry M.D.   On: 07/08/2015 14:05     Visual Acuity Review  Right Eye Distance:   Left Eye Distance:   Bilateral Distance:    Right Eye Near:   Left Eye Near:    Bilateral Near:         MDM   1. Bronchitis with bronchospasm   2. History of COPD     Chest x-ray be done the [facility] even if no pneumonia is seen because of her  recurrent trouble Levaquin is probably be the best drug of choice 500 mg 1 tablet daily, she does not have a PCP so may need per ounce premature 1 puff daily prednisone for 6 days test next 1 teaspoon twice a day and recommended her trying to obtain a PCP if she is not better.   she's been on Levaquin before for apparent UTI will place back on Levaquin and prednisone for 6 days Spiriva daily and she continues this with her albuterol inhaler and will also give a prescription for Tussionex 1 teaspoon twice a day for cough  If she does not have a PCP. Recommend she follow-up with Dr. Salome ArntPlonk  Upstairs.  Note: This dictation was prepared with Dragon dictation along with smaller phrase technology. Any transcriptional errors that result from this process are unintentional.   Hassan RowanEugene Mckenize Mezera, MD 07/08/15 [phone]

## 2015-08-03 ENCOUNTER — Ambulatory Visit
Admission: EM | Admit: 2015-08-03 | Discharge: 2015-08-03 | Disposition: A | Discharge status: Home / Self Care | Payer: Medicare Other | Attending: Family Medicine | Admitting: Family Medicine

## 2015-08-03 ENCOUNTER — Encounter: Payer: Self-pay | Admitting: Emergency Medicine

## 2015-08-03 DIAGNOSIS — R05 Cough: Secondary | ICD-10-CM

## 2015-08-03 DIAGNOSIS — Z8709 Personal history of other diseases of the respiratory system: Secondary | ICD-10-CM | POA: Diagnosis not present

## 2015-08-03 DIAGNOSIS — R053 Chronic cough: Secondary | ICD-10-CM

## 2015-08-03 MED ORDER — PREDNISONE 10 MG (21) PO TBPK: ORAL_TABLET | ORAL | Status: DC

## 2015-08-03 MED ORDER — ALBUTEROL SULFATE HFA 108 (90 BASE) MCG/ACT IN AERS: 2.0000 | INHALATION_SPRAY | RESPIRATORY_TRACT | Status: DC | PRN

## 2015-08-03 MED ORDER — BENZONATATE 100 MG PO CAPS: 100.0000 mg | ORAL_CAPSULE | Freq: Three times a day (TID) | ORAL | Status: DC | PRN

## 2015-08-03 NOTE — ED Notes (Signed)
Pt presents with cough on going for four mths now. Has been seen for same recently. Worse at night.

## 2015-08-03 NOTE — Discharge Instructions (Signed)
Cough, Adult A cough helps to clear your throat and lungs. A cough may last only 2-3 weeks (acute), or it may last longer than 8 weeks (chronic). Many different things can cause a cough. A cough may be a sign of an illness or another medical condition. HOME CARE  Pay attention to any changes in your cough.  Take medicines only as told by your doctor.  If you were prescribed an antibiotic medicine, take it as told by your doctor. Do not stop taking it even if you start to feel better.  Talk with your doctor before you try using a cough medicine.  Drink enough fluid to keep your pee (urine) clear or pale yellow.  If the air is dry, use a cold steam vaporizer or humidifier in your home.  Stay away from things that make you cough at work or at home.  If your cough is worse at night, try using extra pillows to raise your head up higher while you sleep.  Do not smoke, and try not to be around smoke. If you need help quitting, ask your doctor.  Do not have caffeine.  Do not drink alcohol.  Rest as needed. GET HELP IF:  You have new problems (symptoms).  You cough up yellow fluid (pus).  Your cough does not get better after 2-3 weeks, or your cough gets worse.  Medicine does not help your cough and you are not sleeping well.  You have pain that gets worse or pain that is not helped with medicine.  You have a fever.  You are losing weight and you do not know why.  You have night sweats. GET HELP RIGHT AWAY IF: 1. You cough up blood. 2. You have trouble breathing. 3. Your heartbeat is very fast.   This information is not intended to replace advice given to you by your health care provider. Make sure you discuss any questions you have with your health care provider.   Document Released: 10/06/2010 Document Revised: 10/14/2014 Document Reviewed: 04/01/2014 Elsevier Interactive Patient Education 2016 Elsevier Inc. Chronic Obstructive Pulmonary Disease Chronic obstructive  pulmonary disease (COPD) is a common lung problem. In COPD, the flow of air from the lungs is limited. The way your lungs work will probably never return to normal, but there are things you can do to improve your lungs and make yourself feel better. Your doctor may treat your condition with:  Medicines.  Oxygen.  Lung surgery.  Changes to your diet.  Rehabilitation. This may involve a team of specialists. HOME CARE  Take all medicines as told by your doctor.  Avoid medicines or cough syrups that dry up your airway (such as antihistamines) and do not allow you to get rid of thick spit. You do not need to avoid them if told differently by your doctor.  If you smoke, stop. Smoking makes the problem worse.  Avoid being around things that make your breathing worse (like smoke, chemicals, and fumes).  Use oxygen therapy and therapy to help improve your lungs (pulmonary rehabilitation) if told by your doctor. If you need home oxygen therapy, ask your doctor if you should buy a tool to measure your oxygen level (oximeter).  Avoid people who have a sickness you can catch (contagious).  Avoid going outside when it is very hot, cold, or humid.  Eat healthy foods. Eat smaller meals more often. Rest before meals.  Stay active, but remember to also rest.  Make sure to get all the shots (vaccines)  your doctor recommends. Ask your doctor if you need a pneumonia shot.  Learn and use tips on how to relax.  Learn and use tips on how to control your breathing as told by your doctor. Try:  Breathing in (inhaling) through your nose for 1 second. Then, pucker your lips and breath out (exhale) through your lips for 2 seconds.  Putting one hand on your belly (abdomen). Breathe in slowly through your nose for 1 second. Your hand on your belly should move out. Pucker your lips and breathe out slowly through your lips. Your hand on your belly should move in as you breathe out.  Learn and use controlled  coughing to clear thick spit from your lungs. The steps are: 4. Lean your head a little forward. 5. Breathe in deeply. 6. Try to hold your breath for 3 seconds. 7. Keep your mouth slightly open while coughing 2 times. 8. Spit any thick spit out into a tissue. 9. Rest and do the steps again 1 or 2 times as needed. GET HELP IF:  You cough up more thick spit than usual.  There is a change in the color or thickness of the spit.  It is harder to breathe than usual.  Your breathing is faster than usual. GET HELP RIGHT AWAY IF:  You have shortness of breath while resting.  You have shortness of breath that stops you from:  Being able to talk.  Doing normal activities.  You chest hurts for longer than 5 minutes.  Your skin color is more blue than usual.  Your pulse oximeter shows that you have low oxygen for longer than 5 minutes. MAKE SURE YOU:  Understand these instructions.  Will watch your condition.  Will get help right away if you are not doing well or get worse.   This information is not intended to replace advice given to you by your health care provider. Make sure you discuss any questions you have with your health care provider.   Document Released: 07/12/2007 Document Revised: 02/13/2014 Document Reviewed: 09/19/2012 Elsevier Interactive Patient Education [employer]2016 Elsevier Inc.

## 2015-08-03 NOTE — ED Notes (Signed)
Called and left message with receptionist at [facility] office for a referral appointment for Natasha Walker. I will fax over demographics and they will contact patient for appointment. I verified they had the correct contact phone number on file.

## 2015-08-03 NOTE — ED Provider Notes (Signed)
CSN: [id number]     Arrival date & time 08/03/15  1214 History   First MD Initiated Contact with Patient 08/03/15 1305    Nurses notes were reviewed. Chief Complaint  Patient presents with  . Cough   Patient is a 75 year old black female office at least 3 times since April. He states she keeps getting this cough and congestion. She was initially seen by Paul HalfMiss Miller who placed on Zithromax. I saw her less than a month ago place her on Levaquin Spiriva since there was a history in her chart that she had a history of COPD. She states she isn't been diagnosed with COPD she only smoked for about a year when she was [age] and [age] but she does admit that her husband smoked quite heavily she is married for him for over 35 years until he expired. Explained to her that that could be the source of lung damage that she's developing that she has had and why they put a diagnosis of COPD. She does not have a PCP currently. She states that the medication did seem to help but once he got to the medication the cough came back. She describes cough as if frothy white phlegm states is a mucus type nature to it.  She was placed on Cheratussin which she states that seemed to help cough with anything else prednisone and she still has similar Spiriva left. She did not want Tussionex because of the cost. Explained to her that I don't want to maintain her on a narcotic for cough I will recommend pulmonology consult and I'll place her on Tessalon Perles. We probably will renew her prednisone continue with this prevented inhaler and add albuterol inhaler she's been on before until she gets chance to see the pulmonologist. Since she's had chest x-ray last visit will not repeat chest x-ray at this time.  Past history stable above she has stopped smoking after about a year early adult life. She has a history of hypertension hyperlipidemia and doesn't diagnosis of COPD by history. She does have history of heart murmur. She's had a history of  tonsillectomy abdominal hysterectomy tubal ligation and back surgery. No known drug allergies. No no pertinent family medical history. The relates this visit.  (Consider location/radiation/quality/duration/timing/severity/associated sxs/prior Treatment) Patient is a 75 y.o. female presenting with cough. The history is provided by the patient.  Cough Cough characteristics:  Productive Sputum characteristics:  Frothy and white Severity:  Moderate Onset quality:  Unable to specify Timing:  Constant Progression:  Worsening Chronicity:  Recurrent Smoker: no   Context: fumes and upper respiratory infection   Relieved by:  Nothing Worsened by:  Nothing tried Associated symptoms: shortness of breath     Past Medical History  Diagnosis Date  . Hypertension   . Hypercholesteremia   . COPD (chronic obstructive pulmonary disease) [facility](HCC)    Past Surgical History  Procedure Laterality Date  . Abdominal hysterectomy    . Tonsillectomy    . Tubal ligation    . Back surgery     No family history on file. Social History  Substance Use Topics  . Smoking status: Former Games developermoker  . Smokeless tobacco: None  . Alcohol Use: No   OB History    No data available     Review of Systems  Respiratory: Positive for cough and shortness of breath.   All other systems reviewed and are negative.   Allergies  Review of patient's allergies indicates no known allergies.  Home Medications  Prior to Admission medications   Medication Sig Start Date End Date Taking? Authorizing Provider  albuterol (PROVENTIL HFA;VENTOLIN HFA) 108 (90 Base) MCG/ACT inhaler Inhale 1-2 puffs into the lungs every 6 (six) hours as needed for wheezing or shortness of breath. 04/12/15   Payton Mccallum, MD  albuterol (PROVENTIL HFA;VENTOLIN HFA) 108 (90 Base) MCG/ACT inhaler Inhale 2 puffs into the lungs every 4 (four) hours as needed for wheezing or shortness of breath. 08/03/15   Hassan Rowan, MD  aspirin 81 MG tablet Take 81 mg  by mouth daily.    Historical Provider, MD  atorvastatin (LIPITOR) 40 MG tablet Take 40 mg by mouth daily.    Historical Provider, MD  benzonatate (TESSALON PERLES) 100 MG capsule Take 1 capsule (100 mg total) by mouth 3 (three) times daily as needed for cough. 08/03/15   Hassan Rowan, MD  calcium-vitamin D (OSCAL WITH D) 500-200 MG-UNIT tablet Take 1 tablet by mouth daily with breakfast.    Historical Provider, MD  chlorpheniramine-HYDROcodone (TUSSIONEX PENNKINETIC ER) 10-8 MG/5ML SUER Take 5 mLs by mouth every 12 (twelve) hours as needed for cough. 07/08/15   Hassan Rowan, MD  doxycycline (VIBRAMYCIN) 100 MG capsule Take 1 capsule (100 mg total) by mouth 2 (two) times daily. 05/11/15   Renford Dills, NP  felodipine (PLENDIL) 2.5 MG 24 hr tablet Take 2.5 mg by mouth daily.    Historical Provider, MD  fexofenadine (ALLEGRA) 180 MG tablet Take 180 mg by mouth daily.    Historical Provider, MD  fluticasone (FLONASE) 50 MCG/ACT nasal spray Place 2 sprays into both nostrils daily.    Historical Provider, MD  guaiFENesin-codeine 100-10 MG/5ML syrup Take 10 mLs by mouth every 6 (six) hours as needed for cough. 04/12/15   Payton Mccallum, MD  levofloxacin (LEVAQUIN) 500 MG tablet Take 1 tablet (500 mg total) by mouth daily. 04/07/15   Jolene Provost, MD  levofloxacin (LEVAQUIN) 500 MG tablet Take 1 tablet (500 mg total) by mouth daily. 07/08/15   Hassan Rowan, MD  lisinopril (PRINIVIL,ZESTRIL) 2.5 MG tablet Take 2.5 mg by mouth daily.    Historical Provider, MD  omega-3 acid ethyl esters (LOVAZA) 1 g capsule Take 1 g by mouth daily.    Historical Provider, MD  omeprazole (PRILOSEC) 40 MG capsule Take 40 mg by mouth daily.    Historical Provider, MD  predniSONE (STERAPRED UNI-PAK 21 TAB) 10 MG (21) TBPK tablet Sig 6 tablet day 1, 5 tablets day 2, 4 tablets day 3,,3tablets day 4, 2 tablets day 5, 1 tablet day 6 take all tablets orally 08/03/15   Hassan Rowan, MD  tiotropium (SPIRIVA HANDIHALER) 18 MCG inhalation capsule Place  1 capsule (18 mcg total) into inhaler and inhale 1 day or 1 dose. 07/08/15   Hassan Rowan, MD  vitamin B-12 (CYANOCOBALAMIN) 1000 MCG tablet Take 1,000 mcg by mouth daily.    Historical Provider, MD   Meds Ordered and Administered this Visit  Medications - No data to display  BP 144/84 mmHg  Pulse 79  Temp(Src) 98.1 F (36.7 C) (Oral)  Resp 18  SpO2 99% No data found.   Physical Exam  Constitutional: She is oriented to person, place, and time. She appears well-developed and well-nourished.  HENT:  Head: Normocephalic and atraumatic.  Eyes: Conjunctivae are normal. Pupils are equal, round, and reactive to light.  Neck: Normal range of motion. Neck supple.  Cardiovascular: Normal rate and regular rhythm.   Murmur heard. Pulmonary/Chest: Effort normal and breath sounds normal. She  has no wheezes. She has no rales.  Musculoskeletal: Normal range of motion.  Lymphadenopathy:    She has no cervical adenopathy.  Neurological: She is alert and oriented to person, place, and time.  Skin: Skin is warm.  Psychiatric: She has a normal mood and affect.  Vitals reviewed.   ED Course  Procedures (including critical care time)  Labs Review Labs Reviewed - No data to display  Imaging Review No results found.   Visual Acuity Review  Right Eye Distance:   Left Eye Distance:   Bilateral Distance:    Right Eye Near:   Left Eye Near:    Bilateral Near:         MDM   1. Chronic cough   2. History of COPD    As stated above will going to place her on another round of prednisone I'll be gone inhaler continue this we've inhaler and Tessalon Perles for cough. Will proceed with pulmonology referral as well.    Hassan RowanEugene Adir Schicker, MD 08/03/15 1352

## 2015-12-02 DIAGNOSIS — N8110 Cystocele, unspecified: Secondary | ICD-10-CM | POA: Insufficient documentation

## 2016-08-30 DIAGNOSIS — I350 Nonrheumatic aortic (valve) stenosis: Secondary | ICD-10-CM | POA: Insufficient documentation

## 2016-12-01 DIAGNOSIS — Z981 Arthrodesis status: Secondary | ICD-10-CM | POA: Insufficient documentation

## 2016-12-11 ENCOUNTER — Encounter: Payer: Self-pay | Admitting: *Deleted

## 2016-12-11 ENCOUNTER — Ambulatory Visit
Admission: EM | Admit: 2016-12-11 | Discharge: 2016-12-11 | Disposition: A | Discharge status: Home / Self Care | Payer: Medicare Other | Attending: Family Medicine | Admitting: Family Medicine

## 2016-12-11 DIAGNOSIS — Z8639 Personal history of other endocrine, nutritional and metabolic disease: Secondary | ICD-10-CM | POA: Diagnosis not present

## 2016-12-11 DIAGNOSIS — Z7951 Long term (current) use of inhaled steroids: Secondary | ICD-10-CM | POA: Diagnosis not present

## 2016-12-11 DIAGNOSIS — Z87898 Personal history of other specified conditions: Secondary | ICD-10-CM | POA: Insufficient documentation

## 2016-12-11 DIAGNOSIS — R42 Dizziness and giddiness: Secondary | ICD-10-CM

## 2016-12-11 DIAGNOSIS — J449 Chronic obstructive pulmonary disease, unspecified: Secondary | ICD-10-CM | POA: Diagnosis not present

## 2016-12-11 DIAGNOSIS — Z79899 Other long term (current) drug therapy: Secondary | ICD-10-CM | POA: Diagnosis not present

## 2016-12-11 DIAGNOSIS — Z7982 Long term (current) use of aspirin: Secondary | ICD-10-CM | POA: Insufficient documentation

## 2016-12-11 DIAGNOSIS — R5383 Other fatigue: Secondary | ICD-10-CM

## 2016-12-11 DIAGNOSIS — E7800 Pure hypercholesterolemia, unspecified: Secondary | ICD-10-CM | POA: Insufficient documentation

## 2016-12-11 DIAGNOSIS — Z90710 Acquired absence of both cervix and uterus: Secondary | ICD-10-CM | POA: Diagnosis not present

## 2016-12-11 DIAGNOSIS — I10 Essential (primary) hypertension: Secondary | ICD-10-CM | POA: Diagnosis not present

## 2016-12-11 DIAGNOSIS — Z87891 Personal history of nicotine dependence: Secondary | ICD-10-CM | POA: Diagnosis not present

## 2016-12-11 LAB — GLUCOSE, CAPILLARY: GLUCOSE-CAPILLARY: 81 mg/dL (ref 65–99)

## 2016-12-11 NOTE — ED Triage Notes (Signed)
Patient started having symptoms of fatigue and dizziness yesterday. Patient has a history of low blood sugar.

## 2016-12-11 NOTE — ED Provider Notes (Signed)
[facility]    CSN: [id number] Arrival date & time: 12/11/16  1307     History   Chief Complaint Chief Complaint  Patient presents with  . Fatigue    HPI Natasha Walker is a 76 y.o. female.   76 yo female with a c/o fatigue "for one year". States she's worried she may have diabetes. Also states was seen at [facility] ED 2 weeks ago and found to have hypoglycemia. Here today to have her blood sugar checked. Denies any fevers, chills, chest pains, palpitations.    The history is provided by the patient.    Past Medical History:  Diagnosis Date  . COPD (chronic obstructive pulmonary disease) (HCC)   . Hypercholesteremia   . Hypertension     There are no active problems to display for this patient.   Past Surgical History:  Procedure Laterality Date  . ABDOMINAL HYSTERECTOMY    . BACK SURGERY    . TONSILLECTOMY    . TUBAL LIGATION      OB History    No data available       Home Medications    Prior to Admission medications   Medication Sig Start Date End Date Taking? Authorizing Provider  albuterol (PROVENTIL HFA;VENTOLIN HFA) 108 (90 Base) MCG/ACT inhaler Inhale 2 puffs into the lungs every 4 (four) hours as needed for wheezing or shortness of breath. 08/03/15  Yes Hassan Rowan, MD  aspirin 81 MG tablet Take 81 mg by mouth daily.   Yes [provider]  atorvastatin (LIPITOR) 40 MG tablet Take 40 mg by mouth daily.   Yes [provider]  calcium-vitamin D (OSCAL WITH D) 500-200 MG-UNIT tablet Take 1 tablet by mouth daily with breakfast.   Yes [provider]  felodipine (PLENDIL) 2.5 MG 24 hr tablet Take 2.5 mg by mouth daily.   Yes [provider]  fexofenadine (ALLEGRA) 180 MG tablet Take 180 mg by mouth daily.   Yes [provider]  fluticasone (FLONASE) 50 MCG/ACT nasal spray Place 2 sprays into both nostrils daily.   Yes [provider]  lisinopril (PRINIVIL,ZESTRIL) 2.5 MG tablet Take  2.5 mg by mouth daily.   Yes [provider]  omega-3 acid ethyl esters (LOVAZA) 1 g capsule Take 1 g by mouth daily.   Yes [provider]  omeprazole (PRILOSEC) 40 MG capsule Take 40 mg by mouth daily.   Yes [provider]  vitamin B-12 (CYANOCOBALAMIN) 1000 MCG tablet Take 1,000 mcg by mouth daily.   Yes [provider]  albuterol (PROVENTIL HFA;VENTOLIN HFA) 108 (90 Base) MCG/ACT inhaler Inhale 1-2 puffs into the lungs every 6 (six) hours as needed for wheezing or shortness of breath. 04/12/15   Payton Mccallum, MD  benzonatate (TESSALON PERLES) 100 MG capsule Take 1 capsule (100 mg total) by mouth 3 (three) times daily as needed for cough. 08/03/15   Hassan Rowan, MD  chlorpheniramine-HYDROcodone [facility] PENNKINETIC ER) 10-8 MG/5ML SUER Take 5 mLs by mouth every 12 (twelve) hours as needed for cough. 07/08/15   Hassan Rowan, MD  doxycycline (VIBRAMYCIN) 100 MG capsule Take 1 capsule (100 mg total) by mouth 2 (two) times daily. 05/11/15   Renford Dills, NP  guaiFENesin-codeine 100-10 MG/5ML syrup Take 10 mLs by mouth every 6 (six) hours as needed for cough. 04/12/15   Payton Mccallum, MD  levofloxacin (LEVAQUIN) 500 MG tablet Take 1 tablet (500 mg total) by mouth daily. 04/07/15   Jolene Provost, MD  levofloxacin (LEVAQUIN) 500 MG tablet Take 1 tablet (500 mg total) by mouth daily. 07/08/15   Hassan RowanWade, Eugene, MD  predniSONE (STERAPRED UNI-PAK 21 TAB) 10 MG (21) TBPK tablet Sig 6 tablet day 1, 5 tablets day 2, 4 tablets day 3,,3tablets day 4, 2 tablets day 5, 1 tablet day 6 take all tablets orally 08/03/15   Hassan RowanWade, Eugene, MD  tiotropium (SPIRIVA HANDIHALER) 18 MCG inhalation capsule Place 1 capsule (18 mcg total) into inhaler and inhale 1 day or 1 dose. 07/08/15   Hassan RowanWade, Eugene, MD    Family History History reviewed. No pertinent family history.  Social History Social History   Tobacco Use  . Smoking status: Former Games developermoker  . Smokeless tobacco: Never Used  Substance Use  Topics  . Alcohol use: No  . Drug use: No     Allergies   Patient has no known allergies.   Review of Systems Review of Systems   Physical Exam Triage Vital Signs ED Triage Vitals  Enc Vitals Group     BP 12/11/16 1338 (!) 141/68     Pulse Rate 12/11/16 1338 61     Resp 12/11/16 1338 16     Temp 12/11/16 1338 98.3 F (36.8 C)     Temp Source 12/11/16 1338 Oral     SpO2 12/11/16 1338 98 %     Weight 12/11/16 1339 170 lb (77.1 kg)     Height 12/11/16 1339 5\' 2"  (1.575 m)     Head Circumference --      Peak Flow --      Pain Score 12/11/16 1339 0     Pain Loc --      Pain Edu? --      Excl. in GC? --    No data found.  Updated Vital Signs BP (!) 141/68 (BP Location: Left Arm)   Pulse 61   Temp 98.3 F (36.8 C) (Oral)   Resp 16   Ht 5\' 2"  (1.575 m)   Wt 170 lb (77.1 kg)   SpO2 98%   BMI 31.09 kg/m   Visual Acuity Right Eye Distance:   Left Eye Distance:   Bilateral Distance:    Right Eye Near:   Left Eye Near:    Bilateral Near:     Physical Exam  Constitutional: She appears well-developed and well-nourished. No distress.  HENT:  Head: Normocephalic and atraumatic.  Mouth/Throat: Uvula is midline, oropharynx is clear and moist and mucous membranes are normal. No oropharyngeal exudate.  Eyes: Conjunctivae and EOM are normal. Pupils are equal, round, and reactive to light. Right eye exhibits no discharge. Left eye exhibits no discharge. No scleral icterus.  Neck: Normal range of motion. Neck supple. No thyromegaly present.  Cardiovascular: Normal rate, regular rhythm and normal heart sounds.  Pulmonary/Chest: Effort normal and breath sounds normal. No respiratory distress. She has no wheezes. She has no rales.  Lymphadenopathy:    She has no cervical adenopathy.  Skin: No rash noted. She is not diaphoretic.  Nursing note and vitals reviewed.    UC Treatments / Results  Labs (all labs ordered are listed, but only abnormal results are  displayed) Labs Reviewed  GLUCOSE, CAPILLARY  CBG MONITORING, ED    EKG  EKG Interpretation None       Radiology No results found.  Procedures Procedures (including critical care time)  Medications Ordered in UC Medications - No data to display   Initial Impression / Assessment and Plan / UC Course  I  have reviewed the triage vital signs and the nursing notes.  Pertinent labs & imaging results that were available during my care of the patient were reviewed by me and considered in my medical decision making (see chart for details).      Final Clinical Impressions(s) / UC Diagnoses   Final diagnoses:  H/O hypoglycemia    New Prescriptions This SmartLink is deprecated. Use AVSMEDLIST instead to display the medication list for a patient.  1. Lab results and diagnosis reviewed with patient 2.  Recommend supportive treatment with frequent small meals 3. Follow-up with PCP Controlled Substance Prescriptions [place] Controlled Substance Registry consulted? Not Applicable   Payton Mccallum, MD 12/11/16 [phone]

## 2017-05-17 DIAGNOSIS — R001 Bradycardia, unspecified: Secondary | ICD-10-CM | POA: Insufficient documentation

## 2017-11-16 DIAGNOSIS — E782 Mixed hyperlipidemia: Secondary | ICD-10-CM | POA: Insufficient documentation

## 2017-11-16 DIAGNOSIS — H409 Unspecified glaucoma: Secondary | ICD-10-CM | POA: Insufficient documentation

## 2017-11-16 DIAGNOSIS — J45909 Unspecified asthma, uncomplicated: Secondary | ICD-10-CM | POA: Insufficient documentation

## 2017-12-04 DIAGNOSIS — G8929 Other chronic pain: Secondary | ICD-10-CM | POA: Insufficient documentation

## 2017-12-04 DIAGNOSIS — I872 Venous insufficiency (chronic) (peripheral): Secondary | ICD-10-CM | POA: Insufficient documentation

## 2018-01-09 ENCOUNTER — Other Ambulatory Visit: Payer: Self-pay

## 2018-01-09 ENCOUNTER — Encounter: Payer: Self-pay | Admitting: Emergency Medicine

## 2018-01-09 ENCOUNTER — Ambulatory Visit
Admission: EM | Admit: 2018-01-09 | Discharge: 2018-01-09 | Disposition: A | Discharge status: Other Acute Inpatient Hospital | Payer: Medicare Other | Attending: Family Medicine | Admitting: Family Medicine

## 2018-01-09 DIAGNOSIS — I10 Essential (primary) hypertension: Secondary | ICD-10-CM | POA: Insufficient documentation

## 2018-01-09 DIAGNOSIS — I38 Endocarditis, valve unspecified: Secondary | ICD-10-CM | POA: Diagnosis not present

## 2018-01-09 DIAGNOSIS — E785 Hyperlipidemia, unspecified: Secondary | ICD-10-CM | POA: Diagnosis not present

## 2018-01-09 DIAGNOSIS — Z7982 Long term (current) use of aspirin: Secondary | ICD-10-CM | POA: Insufficient documentation

## 2018-01-09 DIAGNOSIS — Z87891 Personal history of nicotine dependence: Secondary | ICD-10-CM | POA: Diagnosis not present

## 2018-01-09 DIAGNOSIS — Z79899 Other long term (current) drug therapy: Secondary | ICD-10-CM | POA: Insufficient documentation

## 2018-01-09 DIAGNOSIS — J449 Chronic obstructive pulmonary disease, unspecified: Secondary | ICD-10-CM | POA: Diagnosis not present

## 2018-01-09 DIAGNOSIS — R42 Dizziness and giddiness: Secondary | ICD-10-CM

## 2018-01-09 DIAGNOSIS — R002 Palpitations: Secondary | ICD-10-CM | POA: Diagnosis not present

## 2018-01-09 DIAGNOSIS — E7800 Pure hypercholesterolemia, unspecified: Secondary | ICD-10-CM | POA: Diagnosis not present

## 2018-01-09 NOTE — ED Triage Notes (Signed)
Patient also reports intermittent "fluttering" in her chest.

## 2018-01-09 NOTE — ED Provider Notes (Addendum)
[facility]    CSN: [id number] Arrival date & time: 01/09/18  1155     History   Chief Complaint Chief Complaint  Patient presents with  . Dizziness  . Palpitations    HPI Natasha Walker is a 77 y.o. female.   77 yo female with a h/o hypertension, valvular heat disease, hyperlipidemia, COPD, presents with a c/o intermittent dizzy spells, "palpitations" ("like skipping beats"), and left shoulder and jaw "heaviness" for the past 5 days. Also states she's felt fatigued, with low energy. Denies any fevers, chills, chest pain, shortness of breath.   The history is provided by the patient.  Dizziness  Associated symptoms: palpitations   Palpitations  Associated symptoms: dizziness     Past Medical History:  Diagnosis Date  . COPD (chronic obstructive pulmonary disease) (HCC)   . Hypercholesteremia   . Hypertension     There are no active problems to display for this patient.   Past Surgical History:  Procedure Laterality Date  . ABDOMINAL HYSTERECTOMY    . BACK SURGERY    . TONSILLECTOMY    . TUBAL LIGATION      OB History   None      Home Medications    Prior to Admission medications   Medication Sig Start Date End Date Taking? Authorizing Provider  albuterol (PROVENTIL HFA;VENTOLIN HFA) 108 (90 Base) MCG/ACT inhaler Inhale 1-2 puffs into the lungs every 6 (six) hours as needed for wheezing or shortness of breath. 04/12/15  Yes Payton Mccallum, MD  albuterol (PROVENTIL HFA;VENTOLIN HFA) 108 (90 Base) MCG/ACT inhaler Inhale 2 puffs into the lungs every 4 (four) hours as needed for wheezing or shortness of breath. 08/03/15  Yes Hassan Rowan, MD  aspirin 81 MG tablet Take 81 mg by mouth daily.   Yes [provider]  atorvastatin (LIPITOR) 40 MG tablet Take 40 mg by mouth daily.   Yes [provider]  calcium-vitamin D (OSCAL WITH D) 500-200 MG-UNIT tablet Take 1 tablet by mouth daily with breakfast.   Yes [provider]    felodipine (PLENDIL) 2.5 MG 24 hr tablet Take 2.5 mg by mouth daily.   Yes [provider]  fexofenadine (ALLEGRA) 180 MG tablet Take 180 mg by mouth daily.   Yes [provider]  fluticasone (FLONASE) 50 MCG/ACT nasal spray Place 2 sprays into both nostrils daily.   Yes [provider]  omega-3 acid ethyl esters (LOVAZA) 1 g capsule Take 1 g by mouth daily.   Yes [provider]  omeprazole (PRILOSEC) 40 MG capsule Take 40 mg by mouth daily.   Yes [provider]  tiotropium (SPIRIVA HANDIHALER) 18 MCG inhalation capsule Place 1 capsule (18 mcg total) into inhaler and inhale 1 day or 1 dose. 07/08/15  Yes Hassan Rowan, MD  vitamin B-12 (CYANOCOBALAMIN) 1000 MCG tablet Take 1,000 mcg by mouth daily.   Yes [provider]  benzonatate (TESSALON PERLES) 100 MG capsule Take 1 capsule (100 mg total) by mouth 3 (three) times daily as needed for cough. 08/03/15   Hassan Rowan, MD  chlorpheniramine-HYDROcodone [facility] PENNKINETIC ER) 10-8 MG/5ML SUER Take 5 mLs by mouth every 12 (twelve) hours as needed for cough. 07/08/15   Hassan Rowan, MD  doxycycline (VIBRAMYCIN) 100 MG capsule Take 1 capsule (100 mg total) by mouth 2 (two) times daily. 05/11/15   Renford Dills, NP  guaiFENesin-codeine 100-10 MG/5ML syrup Take 10 mLs by mouth every 6 (six) hours as needed for cough. 04/12/15  Payton Mccallumonty, Jeovani Weisenburger, MD  levofloxacin (LEVAQUIN) 500 MG tablet Take 1 tablet (500 mg total) by mouth daily. 04/07/15   Jolene ProvostPatel, Kirtida, MD  levofloxacin (LEVAQUIN) 500 MG tablet Take 1 tablet (500 mg total) by mouth daily. 07/08/15   Hassan RowanWade, Eugene, MD  lisinopril (PRINIVIL,ZESTRIL) 2.5 MG tablet Take 2.5 mg by mouth daily.    [provider]  predniSONE (STERAPRED UNI-PAK 21 TAB) 10 MG (21) TBPK tablet Sig 6 tablet day 1, 5 tablets day 2, 4 tablets day 3,,3tablets day 4, 2 tablets day 5, 1 tablet day 6 take all tablets orally 08/03/15   Hassan RowanWade, Eugene, MD    Family History History  reviewed. No pertinent family history.  Social History Social History   Tobacco Use  . Smoking status: Former Games developermoker  . Smokeless tobacco: Never Used  Substance Use Topics  . Alcohol use: No  . Drug use: No     Allergies   Patient has no known allergies.   Review of Systems Review of Systems  Cardiovascular: Positive for palpitations.  Neurological: Positive for dizziness.     Physical Exam Triage Vital Signs ED Triage Vitals  Enc Vitals Group     BP 01/09/18 1219 (!) 157/93     Pulse Rate 01/09/18 1219 62     Resp 01/09/18 1219 18     Temp 01/09/18 1219 97.7 F (36.5 C)     Temp Source 01/09/18 1219 Oral     SpO2 01/09/18 1219 97 %     Weight 01/09/18 1217 170 lb (77.1 kg)     Height 01/09/18 1217 5' (1.524 m)     Head Circumference --      Peak Flow --      Pain Score 01/09/18 1216 3     Pain Loc --      Pain Edu? --      Excl. in GC? --    No data found.  Updated Vital Signs BP (!) 157/93 (BP Location: Right Arm)   Pulse 62   Temp 97.7 F (36.5 C) (Oral)   Resp 18   Ht 5' (1.524 m)   Wt 77.1 kg   SpO2 97%   BMI 33.20 kg/m   Visual Acuity Right Eye Distance:   Left Eye Distance:   Bilateral Distance:    Right Eye Near:   Left Eye Near:    Bilateral Near:     Physical Exam  Constitutional: She appears well-developed and well-nourished. No distress.  Cardiovascular: Normal rate, regular rhythm and normal heart sounds.  Pulmonary/Chest: Effort normal and breath sounds normal. No stridor. No respiratory distress. She has no wheezes.  Skin: She is not diaphoretic.  Nursing note and vitals reviewed.    UC Treatments / Results  Labs (all labs ordered are listed, but only abnormal results are displayed) Labs Reviewed - No data to display  EKG: sinus rhythm with sinus arrhythmia, LVH, non-specific T wave abnormality  None     Radiology No results found.  Procedures Procedures (including critical care time)  Medications Ordered in  UC Medications - No data to display  Initial Impression / Assessment and Plan / UC Course  I have reviewed the triage vital signs and the nursing notes.  Pertinent labs & imaging results that were available during my care of the patient were reviewed by me and considered in my medical decision making (see chart for details).      Final Clinical Impressions(s) / UC Diagnoses   Final diagnoses:  Palpitations  Dizziness  Valvular heart disease     Discharge Instructions     Recommend patient go to Emergency Department for further evaluation and management    ED Prescriptions    None     Controlled Substance Prescriptions [place] Controlled Substance Registry consulted? Not Applicable   Payton Mccallum, MD 01/09/18 1612    Payton Mccallum, MD 01/09/18 [phone]

## 2018-01-09 NOTE — ED Triage Notes (Signed)
Patient c/o dizziness x 3 days and states she feels "tired." She also states her left arm feels "heavy" that started 5 days ago.

## 2018-01-09 NOTE — Discharge Instructions (Signed)
Recommend patient go to Emergency Department for further evaluation and management °

## 2018-03-02 IMAGING — CR DG CHEST 2V
2 series · 2 of 2 positions shown · non-contrast
Comparison: Chest x-ray of 05/11/2015

CLINICAL DATA: Persistent cough for 2 months following antibiotic
treatment, general malaise

EXAM:
CHEST  2 VIEW

[chest pa]
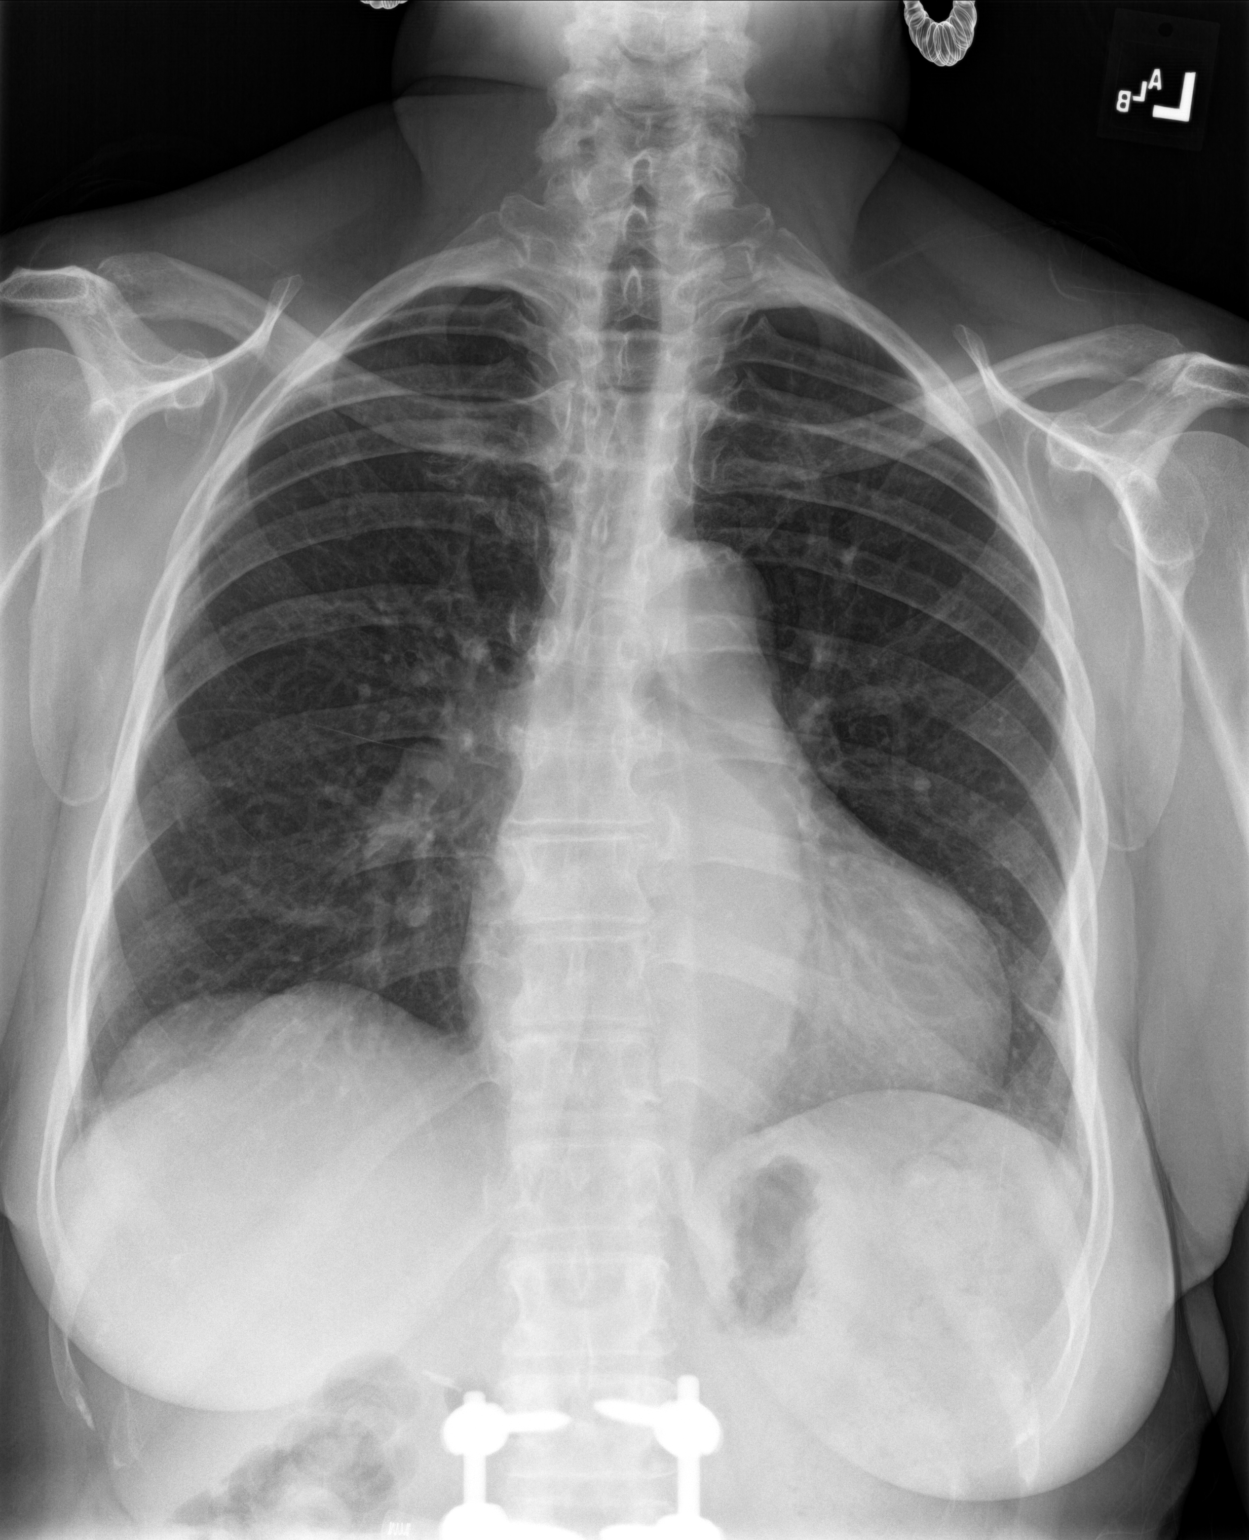

[chest lat]
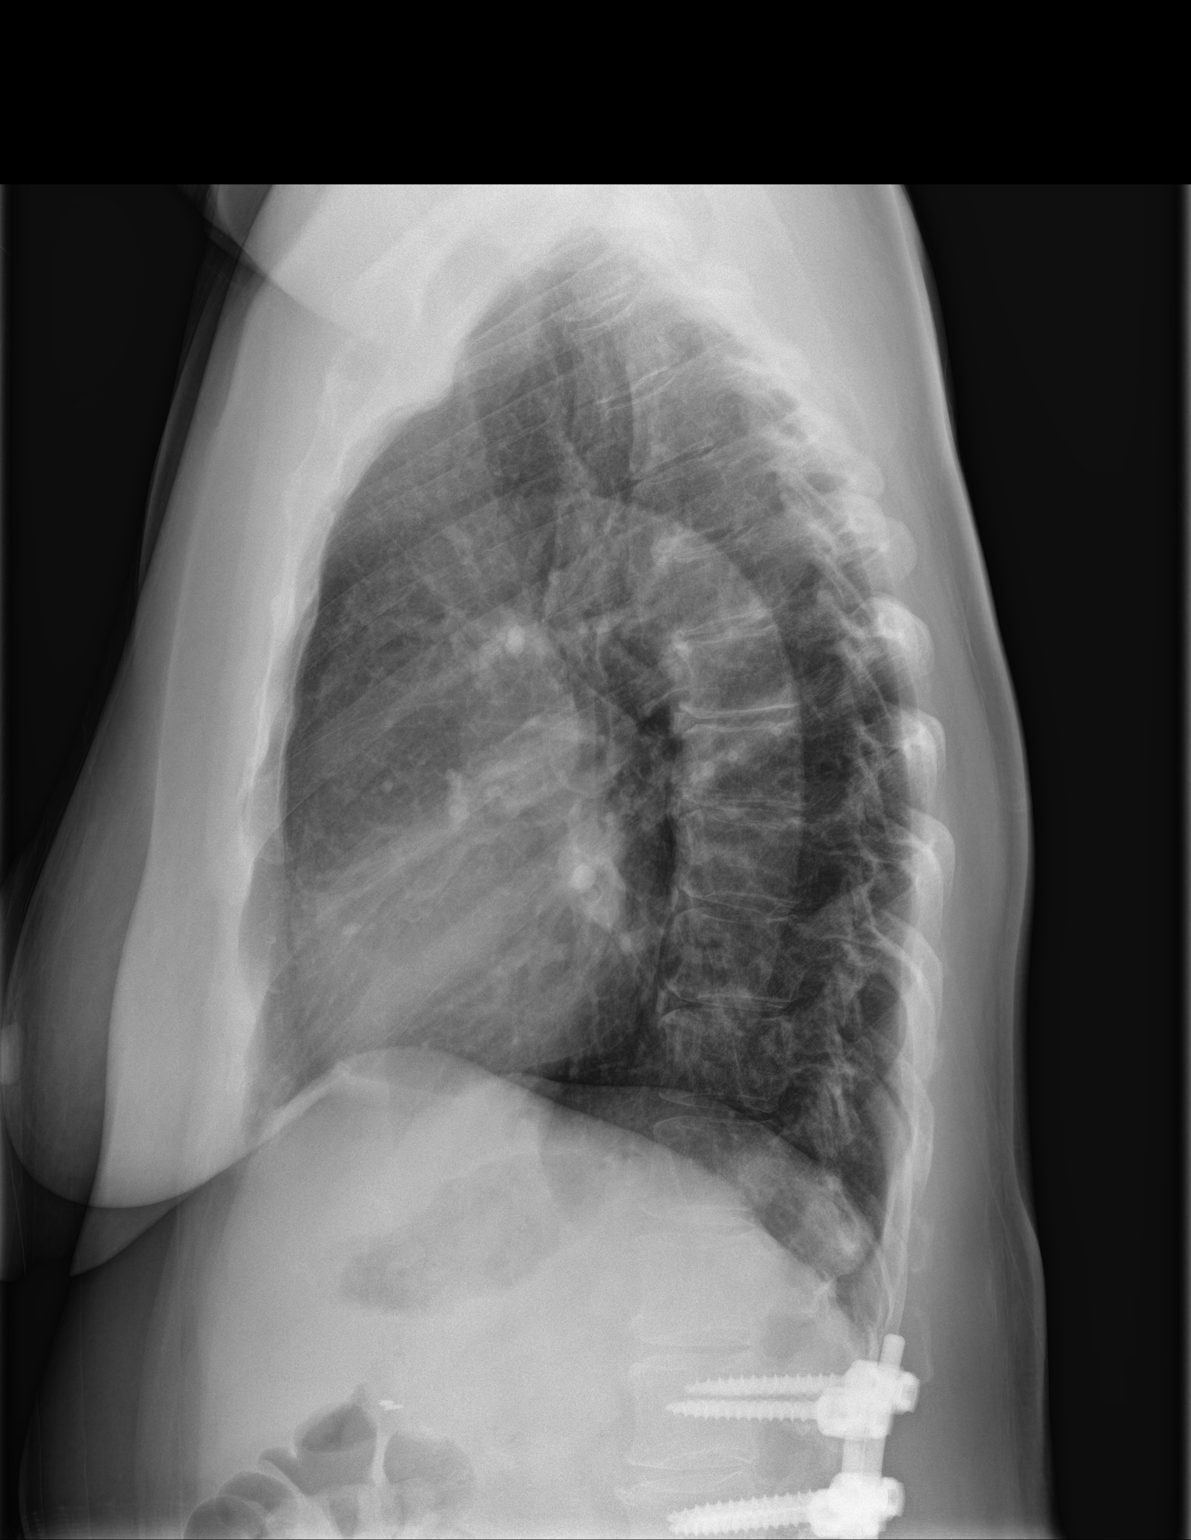

[2 of 2 positions shown; findings below may reference images not displayed]

FINDINGS: No active infiltrate or effusion is seen. Mediastinal and hilar
contours are unremarkable. The heart is mildly enlarged. The
descending thoracic aorta is tortuous. No bony abnormality is seen.
Hardware for fusion of the lower lumbar spine is noted.
IMPRESSION: Stable mild cardiomegaly.  No active lung disease.

## 2018-07-18 DIAGNOSIS — I420 Dilated cardiomyopathy: Secondary | ICD-10-CM | POA: Insufficient documentation

## 2019-10-14 ENCOUNTER — Other Ambulatory Visit: Payer: Self-pay

## 2019-10-14 ENCOUNTER — Encounter: Payer: Self-pay | Admitting: Ophthalmology

## 2019-10-16 ENCOUNTER — Other Ambulatory Visit: Payer: Self-pay

## 2019-10-16 ENCOUNTER — Other Ambulatory Visit
Admission: RE | Admit: 2019-10-16 | Discharge: 2019-10-16 | Disposition: A | Discharge status: Home / Self Care | Payer: Medicare Other | Source: Ambulatory Visit | Attending: Ophthalmology | Admitting: Ophthalmology

## 2019-10-16 DIAGNOSIS — Z20822 Contact with and (suspected) exposure to covid-19: Secondary | ICD-10-CM | POA: Diagnosis not present

## 2019-10-16 DIAGNOSIS — Z01812 Encounter for preprocedural laboratory examination: Secondary | ICD-10-CM | POA: Diagnosis present

## 2019-10-16 NOTE — Discharge Instructions (Signed)

## 2019-10-17 LAB — SARS CORONAVIRUS 2 (TAT 6-24 HRS): SARS Coronavirus 2: NEGATIVE

## 2019-10-20 ENCOUNTER — Ambulatory Visit
Admission: RE | Admit: 2019-10-20 | Discharge: 2019-10-20 | Disposition: A | Discharge status: Home / Self Care | Payer: Medicare Other | Attending: Ophthalmology | Admitting: Ophthalmology

## 2019-10-20 ENCOUNTER — Ambulatory Visit: Payer: Medicare Other | Admitting: Anesthesiology

## 2019-10-20 ENCOUNTER — Other Ambulatory Visit: Payer: Self-pay

## 2019-10-20 ENCOUNTER — Encounter: Payer: Self-pay | Admitting: Ophthalmology

## 2019-10-20 ENCOUNTER — Encounter
Admission: RE | Disposition: A | Discharge status: Home / Self Care | Payer: Self-pay | Source: Home / Self Care | Attending: Ophthalmology

## 2019-10-20 DIAGNOSIS — M1990 Unspecified osteoarthritis, unspecified site: Secondary | ICD-10-CM | POA: Insufficient documentation

## 2019-10-20 DIAGNOSIS — I10 Essential (primary) hypertension: Secondary | ICD-10-CM | POA: Insufficient documentation

## 2019-10-20 DIAGNOSIS — Z79899 Other long term (current) drug therapy: Secondary | ICD-10-CM | POA: Diagnosis not present

## 2019-10-20 DIAGNOSIS — H2512 Age-related nuclear cataract, left eye: Secondary | ICD-10-CM | POA: Insufficient documentation

## 2019-10-20 DIAGNOSIS — Z7982 Long term (current) use of aspirin: Secondary | ICD-10-CM | POA: Insufficient documentation

## 2019-10-20 DIAGNOSIS — J449 Chronic obstructive pulmonary disease, unspecified: Secondary | ICD-10-CM | POA: Diagnosis not present

## 2019-10-20 DIAGNOSIS — Z8673 Personal history of transient ischemic attack (TIA), and cerebral infarction without residual deficits: Secondary | ICD-10-CM | POA: Diagnosis not present

## 2019-10-20 DIAGNOSIS — Z87891 Personal history of nicotine dependence: Secondary | ICD-10-CM | POA: Diagnosis not present

## 2019-10-20 DIAGNOSIS — H401121 Primary open-angle glaucoma, left eye, mild stage: Secondary | ICD-10-CM | POA: Diagnosis not present

## 2019-10-20 DIAGNOSIS — J45909 Unspecified asthma, uncomplicated: Secondary | ICD-10-CM | POA: Diagnosis not present

## 2019-10-20 DIAGNOSIS — Z888 Allergy status to other drugs, medicaments and biological substances status: Secondary | ICD-10-CM | POA: Insufficient documentation

## 2019-10-20 HISTORY — DX: Dizziness and giddiness: R42

## 2019-10-20 HISTORY — PX: CATARACT EXTRACTION W/PHACO: SHX586

## 2019-10-20 HISTORY — DX: Presence of dental prosthetic device (complete) (partial): Z97.2

## 2019-10-20 HISTORY — DX: Transient cerebral ischemic attack, unspecified: G45.9

## 2019-10-20 SURGERY — PHACOEMULSIFICATION, CATARACT, WITH IOL INSERTION
Anesthesia: Monitor Anesthesia Care | Site: Eye | Laterality: Left

## 2019-10-20 MED ORDER — LACTATED RINGERS IV SOLN: INTRAVENOUS | Status: DC

## 2019-10-20 MED ORDER — TETRACAINE HCL 0.5 % OP SOLN
1.0000 [drp] | OPHTHALMIC | Status: DC | PRN
  Administered 2019-10-20 (×3): 1 [drp] via OPHTHALMIC

## 2019-10-20 MED ORDER — SODIUM HYALURONATE 10 MG/ML IO SOLN
INTRAOCULAR | Status: DC | PRN
  Administered 2019-10-20: 0.55 mL via INTRAOCULAR

## 2019-10-20 MED ORDER — MIDAZOLAM HCL 2 MG/2ML IJ SOLN
INTRAMUSCULAR | Status: DC | PRN
  Administered 2019-10-20: 1 mg via INTRAVENOUS

## 2019-10-20 MED ORDER — ARMC OPHTHALMIC DILATING DROPS
1.0000 "application " | OPHTHALMIC | Status: DC | PRN
  Administered 2019-10-20 (×3): 1 via OPHTHALMIC

## 2019-10-20 MED ORDER — ACETAMINOPHEN 500 MG PO TABS
1000.0000 mg | ORAL_TABLET | Freq: Four times a day (QID) | ORAL | Status: DC | PRN
  Administered 2019-10-20: 1000 mg via ORAL

## 2019-10-20 MED ORDER — SODIUM HYALURONATE 23 MG/ML IO SOLN
INTRAOCULAR | Status: DC | PRN
  Administered 2019-10-20: 0.6 mL via INTRAOCULAR

## 2019-10-20 MED ORDER — MOXIFLOXACIN HCL 0.5 % OP SOLN
OPHTHALMIC | Status: DC | PRN
  Administered 2019-10-20: 0.2 mL via OPHTHALMIC

## 2019-10-20 MED ORDER — FENTANYL CITRATE (PF) 100 MCG/2ML IJ SOLN
INTRAMUSCULAR | Status: DC | PRN
Start: 2019-10-20 — End: 2019-10-20
  Administered 2019-10-20: 50 ug via INTRAVENOUS

## 2019-10-20 MED ORDER — LIDOCAINE HCL (PF) 2 % IJ SOLN
INTRAOCULAR | Status: DC | PRN
  Administered 2019-10-20: 1 mL via INTRAOCULAR

## 2019-10-20 MED ORDER — EPINEPHRINE PF 1 MG/ML IJ SOLN
INTRAOCULAR | Status: DC | PRN
  Administered 2019-10-20: 73 mL via OPHTHALMIC

## 2019-10-20 SURGICAL SUPPLY — 22 items
CANNULA ANT/CHMB 27G (MISCELLANEOUS) ×2 IMPLANT
CANNULA ANT/CHMB 27GA (MISCELLANEOUS) ×4 IMPLANT
DEVICE INJECT ISTENT W (Stent) IMPLANT
DISSECTOR HYDRO NUCLEUS 50X22 (MISCELLANEOUS) ×2 IMPLANT
GLOVE SURG LX 7.5 STRW (GLOVE) ×1
GLOVE SURG LX STRL 7.5 STRW (GLOVE) ×1 IMPLANT
GLOVE SURG SYN 8.5  E (GLOVE) ×1
GLOVE SURG SYN 8.5 E (GLOVE) ×1 IMPLANT
GLOVE SURG SYN 8.5 PF PI (GLOVE) ×1 IMPLANT
GOWN STRL REUS W/ TWL LRG LVL3 (GOWN DISPOSABLE) ×2 IMPLANT
GOWN STRL REUS W/TWL LRG LVL3 (GOWN DISPOSABLE) ×4
ICLIP (OPHTHALMIC RELATED) ×1 IMPLANT
INJECT ISTENT W (Stent) ×2 IMPLANT
LENS IOL TECNIS EYHANCE 20.5 ×1 IMPLANT
MARKER SKIN DUAL TIP RULER LAB (MISCELLANEOUS) ×2 IMPLANT
PACK DR. KING ARMS (PACKS) ×2 IMPLANT
PACK EYE AFTER SURG (MISCELLANEOUS) ×2 IMPLANT
PACK OPTHALMIC (MISCELLANEOUS) ×2 IMPLANT
SYR 3ML LL SCALE MARK (SYRINGE) ×2 IMPLANT
SYR TB 1ML LUER SLIP (SYRINGE) ×2 IMPLANT
WATER STERILE IRR 250ML POUR (IV SOLUTION) ×2 IMPLANT
WIPE NON LINTING 3.25X3.25 (MISCELLANEOUS) ×2 IMPLANT

## 2019-10-20 NOTE — Anesthesia Procedure Notes (Signed)
Procedure Name: MAC Date/Time: 10/20/2019 12:35 PM Performed by: Cameron Ali, CRNA Pre-anesthesia Checklist: Patient identified, Emergency Drugs available, Suction available, Timeout performed and Patient being monitored Patient Re-evaluated:Patient Re-evaluated prior to induction Oxygen Delivery Method: Nasal cannula Placement Confirmation: positive ETCO2

## 2019-10-20 NOTE — Anesthesia Preprocedure Evaluation (Signed)
Anesthesia Evaluation  Patient identified by MRN, date of birth, ID band Patient awake    Reviewed: Allergy & Precautions, H&P , NPO status , Patient's Chart, lab work & pertinent test results  Airway Mallampati: II  TM Distance: >3 FB Neck ROM: full    Dental no notable dental hx.    Pulmonary COPD, former smoker,    Pulmonary exam normal        Cardiovascular hypertension, On Medications Normal cardiovascular exam Rhythm:regular Rate:Normal     Neuro/Psych negative neurological ROS     GI/Hepatic negative GI ROS, Neg liver ROS,   Endo/Other  negative endocrine ROS  Renal/GU negative Renal ROS  negative genitourinary   Musculoskeletal   Abdominal   Peds  Hematology negative hematology ROS (+)   Anesthesia Other Findings   Reproductive/Obstetrics                            Anesthesia Physical Anesthesia Plan  ASA: II  Anesthesia Plan: MAC   Post-op Pain Management:    Induction:   PONV Risk Score and Plan:   Airway Management Planned:   Additional Equipment:   Intra-op Plan:   Post-operative Plan:   Informed Consent: I have reviewed the patients History and Physical, chart, labs and discussed the procedure including the risks, benefits and alternatives for the proposed anesthesia with the patient or authorized representative who has indicated his/her understanding and acceptance.       Plan Discussed with:   Anesthesia Plan Comments:         Anesthesia Quick Evaluation

## 2019-10-20 NOTE — Op Note (Signed)
OPERATIVE NOTE  Natasha Walker [id number] 10/20/2019  PREOPERATIVE DIAGNOSIS:   1.  Mild  PRIMARY open angle glaucoma, left eye. A25.0539  2.  Nuclear sclerotic cataract left eye.  H25.12   POSTOPERATIVE DIAGNOSIS:    same.   PROCEDURE:   1.  Placement of trabecular bypass stent (istent). CPT 0191T  and placement of additional stent, LEFT EYE  CPT 0376T 2.  Phacoemusification with posterior chamber intraocular lens placement of the left eye  CPT [phone]   LENS: Implant Name Type Inv. Item Serial No. Manufacturer Lot No. LRB No. Used Action  Marko Stai - [id number] Stent Marko Stai [id number] [facility] [employer]  Left 1 Implanted  LENS II EYHANCE 20.5 - [id number]  LENS II EYHANCE 20.5 [id number] JOHNSON   Left 1 Implanted      Procedure(s): CATARACT EXTRACTION PHACO AND INTRAOCULAR LENS PLACEMENT (IOC) LEFT ISTENT INJ 7.13  00:42.2 (Left)  DIB00 +20.5   ULTRASOUND TIME: 0 minutes 42 seconds.  CDE 7.13   SURGEON:  Benay Pillow, MD, MPH  ANESTHESIOLOGIST: Anesthesiologist: Elgie Collard, MD CRNA: Cameron Ali, CRNA   ANESTHESIA:  MAC and intracameral preservative-free intracameral lidocaine 4%.  ESTIMATED BLOOD LOSS: less than 1 mL.   COMPLICATIONS:  None.   DESCRIPTION OF PROCEDURE:  The patient was identified in the holding room and transported to the operating room.  The patient was placed in the supine position under the operating microscope.  The left eye was prepped and draped in the usual sterile ophthalmic fashion.   A 1.0 millimeter clear-corneal paracentesis was made at the 4:30 position. 0.5 ml of preservative-free 1% lidocaine with epinephrine was injected into the anterior chamber.  The anterior chamber was filled with Healon 5 viscoelastic.  A 2.4 millimeter keratome was used to make a near-clear corneal incision at the 2:00 position.   Attention was turned to the istent.  The patients head was turned to the left and the microscope was tilted  to 035 degrees.  Ocular instruments/Glaukos OAL/H2 gonioprism was used with IPC05 (iclip) coupled with Healon 5 on the cornea was used to visualize the trabecular meshwork. The istent was opened and introduced into the eye.  The meshwork was engaged with the tip of the iStent injector and the stent was deployed into Schlemm's canal at 10:30.  The second stent was deployed at 8:00.  The stents were well seated and in good position.  Next, attention was turned to the phacoemulsification A curvilinear capsulorrhexis was made with a cystotome and capsulorrhexis forceps.  Balanced salt solution was used to hydrodissect and hydrodelineate the nucleus.   Phacoemulsification was then used in stop and chop fashion to remove the lens nucleus and epinucleus.  The remaining cortex was then removed using the irrigation and aspiration handpiece. Healon was then placed into the capsular bag to distend it for lens placement.  A lens was then injected into the capsular bag.  The remaining viscoelastic was aspirated.   Wounds were hydrated with balanced salt solution.  The anterior chamber was inflated to a physiologic pressure with balanced salt solution.   Intracameral vigamox 0.1 mL undiluted was injected into the eye and a drop placed onto the ocular surface.  No wound leaks were noted.  Protective glasses were placed on the patient.  The patient was taken to the recovery room in stable condition without complications of anesthesia or surgery   Benay Pillow 10/20/2019, 1:10 PM

## 2019-10-20 NOTE — Transfer of Care (Signed)
Immediate Anesthesia Transfer of Care Note  Patient: Natasha Walker  Procedure(s) Performed: CATARACT EXTRACTION PHACO AND INTRAOCULAR LENS PLACEMENT (IOC) LEFT ISTENT INJ 7.13  00:42.2 (Left Eye)  Patient Location: PACU  Anesthesia Type: MAC  Level of Consciousness: awake, alert  and patient cooperative  Airway and Oxygen Therapy: Patient Spontanous Breathing and Patient connected to supplemental oxygen  Post-op Assessment: Post-op Vital signs reviewed, Patient's Cardiovascular Status Stable, Respiratory Function Stable, Patent Airway and No signs of Nausea or vomiting  Post-op Vital Signs: Reviewed and stable  Complications: No complications documented.

## 2019-10-20 NOTE — Anesthesia Postprocedure Evaluation (Signed)
Anesthesia Post Note  Patient: Natasha Walker  Procedure(s) Performed: CATARACT EXTRACTION PHACO AND INTRAOCULAR LENS PLACEMENT (IOC) LEFT ISTENT INJ 7.13  00:42.2 (Left Eye)     Patient location during evaluation: PACU Anesthesia Type: MAC Level of consciousness: awake and alert Pain management: pain level controlled Vital Signs Assessment: post-procedure vital signs reviewed and stable Respiratory status: spontaneous breathing Cardiovascular status: stable Postop Assessment: no headache Anesthetic complications: no   No complications documented.  Gillian Scarce

## 2019-10-20 NOTE — H&P (Signed)

## 2019-10-21 ENCOUNTER — Encounter: Payer: Self-pay | Admitting: Ophthalmology

## 2019-11-03 ENCOUNTER — Other Ambulatory Visit: Payer: Self-pay

## 2019-11-03 ENCOUNTER — Encounter: Payer: Self-pay | Admitting: Ophthalmology

## 2019-11-06 ENCOUNTER — Other Ambulatory Visit
Admission: RE | Admit: 2019-11-06 | Discharge: 2019-11-06 | Disposition: A | Discharge status: Home / Self Care | Payer: Medicare Other | Source: Ambulatory Visit | Attending: Ophthalmology | Admitting: Ophthalmology

## 2019-11-06 ENCOUNTER — Other Ambulatory Visit: Payer: Self-pay

## 2019-11-06 DIAGNOSIS — Z01812 Encounter for preprocedural laboratory examination: Secondary | ICD-10-CM | POA: Insufficient documentation

## 2019-11-06 DIAGNOSIS — Z20822 Contact with and (suspected) exposure to covid-19: Secondary | ICD-10-CM | POA: Insufficient documentation

## 2019-11-06 LAB — SARS CORONAVIRUS 2 (TAT 6-24 HRS): SARS Coronavirus 2: NEGATIVE

## 2019-11-06 NOTE — Discharge Instructions (Signed)

## 2019-11-10 ENCOUNTER — Other Ambulatory Visit: Payer: Self-pay

## 2019-11-10 ENCOUNTER — Ambulatory Visit: Payer: Medicare Other | Admitting: Anesthesiology

## 2019-11-10 ENCOUNTER — Ambulatory Visit
Admission: RE | Admit: 2019-11-10 | Discharge: 2019-11-10 | Disposition: A | Discharge status: Home / Self Care | Payer: Medicare Other | Attending: Ophthalmology | Admitting: Ophthalmology

## 2019-11-10 ENCOUNTER — Encounter
Admission: RE | Disposition: A | Discharge status: Home / Self Care | Payer: Self-pay | Source: Home / Self Care | Attending: Ophthalmology

## 2019-11-10 DIAGNOSIS — H2511 Age-related nuclear cataract, right eye: Secondary | ICD-10-CM | POA: Insufficient documentation

## 2019-11-10 DIAGNOSIS — K219 Gastro-esophageal reflux disease without esophagitis: Secondary | ICD-10-CM | POA: Insufficient documentation

## 2019-11-10 DIAGNOSIS — J45909 Unspecified asthma, uncomplicated: Secondary | ICD-10-CM | POA: Diagnosis not present

## 2019-11-10 DIAGNOSIS — I10 Essential (primary) hypertension: Secondary | ICD-10-CM | POA: Insufficient documentation

## 2019-11-10 DIAGNOSIS — J449 Chronic obstructive pulmonary disease, unspecified: Secondary | ICD-10-CM | POA: Insufficient documentation

## 2019-11-10 DIAGNOSIS — Z8673 Personal history of transient ischemic attack (TIA), and cerebral infarction without residual deficits: Secondary | ICD-10-CM | POA: Insufficient documentation

## 2019-11-10 DIAGNOSIS — Z7982 Long term (current) use of aspirin: Secondary | ICD-10-CM | POA: Insufficient documentation

## 2019-11-10 DIAGNOSIS — Z791 Long term (current) use of non-steroidal anti-inflammatories (NSAID): Secondary | ICD-10-CM | POA: Diagnosis not present

## 2019-11-10 DIAGNOSIS — Z888 Allergy status to other drugs, medicaments and biological substances status: Secondary | ICD-10-CM | POA: Insufficient documentation

## 2019-11-10 DIAGNOSIS — M1990 Unspecified osteoarthritis, unspecified site: Secondary | ICD-10-CM | POA: Diagnosis not present

## 2019-11-10 DIAGNOSIS — Z87891 Personal history of nicotine dependence: Secondary | ICD-10-CM | POA: Diagnosis not present

## 2019-11-10 HISTORY — PX: CATARACT EXTRACTION W/PHACO: SHX586

## 2019-11-10 SURGERY — PHACOEMULSIFICATION, CATARACT, WITH IOL INSERTION
Anesthesia: Monitor Anesthesia Care | Site: Eye | Laterality: Right

## 2019-11-10 MED ORDER — SODIUM HYALURONATE 10 MG/ML IO SOLN
INTRAOCULAR | Status: DC | PRN
  Administered 2019-11-10: 0.55 mL via INTRAOCULAR

## 2019-11-10 MED ORDER — LACTATED RINGERS IV SOLN: INTRAVENOUS | Status: DC

## 2019-11-10 MED ORDER — FENTANYL CITRATE (PF) 100 MCG/2ML IJ SOLN
INTRAMUSCULAR | Status: DC | PRN
  Administered 2019-11-10: 50 ug via INTRAVENOUS

## 2019-11-10 MED ORDER — ACETAMINOPHEN 160 MG/5ML PO SOLN: 325.0000 mg | Freq: Once | ORAL | Status: AC

## 2019-11-10 MED ORDER — ARMC OPHTHALMIC DILATING DROPS
1.0000 "application " | OPHTHALMIC | Status: DC | PRN
  Administered 2019-11-10 (×2): 1 via OPHTHALMIC

## 2019-11-10 MED ORDER — SODIUM HYALURONATE 23 MG/ML IO SOLN
INTRAOCULAR | Status: DC | PRN
  Administered 2019-11-10: 0.6 mL via INTRAOCULAR

## 2019-11-10 MED ORDER — ACETAMINOPHEN 325 MG PO TABS
325.0000 mg | ORAL_TABLET | Freq: Once | ORAL | Status: AC
  Administered 2019-11-10: 650 mg via ORAL

## 2019-11-10 MED ORDER — MOXIFLOXACIN HCL 0.5 % OP SOLN
OPHTHALMIC | Status: DC | PRN
  Administered 2019-11-10: 0.2 mL via OPHTHALMIC

## 2019-11-10 MED ORDER — LIDOCAINE HCL (PF) 2 % IJ SOLN
INTRAOCULAR | Status: DC | PRN
  Administered 2019-11-10: 1 mL via INTRAOCULAR

## 2019-11-10 MED ORDER — EPINEPHRINE PF 1 MG/ML IJ SOLN
INTRAOCULAR | Status: DC | PRN
  Administered 2019-11-10: 82 mL via OPHTHALMIC

## 2019-11-10 MED ORDER — MIDAZOLAM HCL 2 MG/2ML IJ SOLN
INTRAMUSCULAR | Status: DC | PRN
  Administered 2019-11-10: 1 mg via INTRAVENOUS

## 2019-11-10 MED ORDER — TETRACAINE HCL 0.5 % OP SOLN
1.0000 [drp] | OPHTHALMIC | Status: DC | PRN
  Administered 2019-11-10 (×3): 1 [drp] via OPHTHALMIC

## 2019-11-10 SURGICAL SUPPLY — 17 items
CANNULA ANT/CHMB 27GA (MISCELLANEOUS) ×4 IMPLANT
DISSECTOR HYDRO NUCLEUS 50X22 (MISCELLANEOUS) ×2 IMPLANT
GLOVE SURG LX 7.5 STRW (GLOVE) ×2
GLOVE SURG LX STRL 7.5 STRW (GLOVE) ×2 IMPLANT
GLOVE SURG SYN 8.5  E (GLOVE) ×1
GLOVE SURG SYN 8.5 E (GLOVE) ×1 IMPLANT
GOWN STRL REUS W/ TWL LRG LVL3 (GOWN DISPOSABLE) ×2 IMPLANT
GOWN STRL REUS W/TWL LRG LVL3 (GOWN DISPOSABLE) ×4
LENS IOL TECNIS EYHANCE 19.0 (Intraocular Lens) ×2 IMPLANT
MARKER SKIN DUAL TIP RULER LAB (MISCELLANEOUS) ×2 IMPLANT
PACK DR. KING ARMS (PACKS) ×2 IMPLANT
PACK EYE AFTER SURG (MISCELLANEOUS) ×2 IMPLANT
PACK OPTHALMIC (MISCELLANEOUS) ×2 IMPLANT
SYR 3ML LL SCALE MARK (SYRINGE) ×2 IMPLANT
SYR TB 1ML LUER SLIP (SYRINGE) ×2 IMPLANT
WATER STERILE IRR 250ML POUR (IV SOLUTION) ×2 IMPLANT
WIPE NON LINTING 3.25X3.25 (MISCELLANEOUS) ×2 IMPLANT

## 2019-11-10 NOTE — Transfer of Care (Signed)
Immediate Anesthesia Transfer of Care Note  Patient: Natasha Walker  Procedure(s) Performed: CATARACT EXTRACTION PHACO AND INTRAOCULAR LENS PLACEMENT (IOC) RIGHT (Right Eye)  Patient Location: PACU  Anesthesia Type: MAC  Level of Consciousness: awake, alert  and patient cooperative  Airway and Oxygen Therapy: Patient Spontanous Breathing and Patient connected to supplemental oxygen  Post-op Assessment: Post-op Vital signs reviewed, Patient's Cardiovascular Status Stable, Respiratory Function Stable, Patent Airway and No signs of Nausea or vomiting  Post-op Vital Signs: Reviewed and stable  Complications: No complications documented.

## 2019-11-10 NOTE — Anesthesia Procedure Notes (Signed)
Procedure Name: MAC Date/Time: 11/10/2019 11:11 AM Performed by: Silvana Newness, CRNA Pre-anesthesia Checklist: Patient identified, Emergency Drugs available, Suction available, Patient being monitored and Timeout performed Patient Re-evaluated:Patient Re-evaluated prior to induction Oxygen Delivery Method: Nasal cannula Placement Confirmation: positive ETCO2

## 2019-11-10 NOTE — Anesthesia Postprocedure Evaluation (Signed)
Anesthesia Post Note  Patient: Natasha Walker  Procedure(s) Performed: CATARACT EXTRACTION PHACO AND INTRAOCULAR LENS PLACEMENT (IOC) RIGHT (Right Eye)     Patient location during evaluation: PACU Anesthesia Type: MAC Level of consciousness: awake and alert and oriented Pain management: satisfactory to patient Vital Signs Assessment: post-procedure vital signs reviewed and stable Respiratory status: spontaneous breathing, nonlabored ventilation and respiratory function stable Cardiovascular status: blood pressure returned to baseline and stable Postop Assessment: Adequate PO intake and No signs of nausea or vomiting Anesthetic complications: no   Pts HR in the 50s-60s postop.  Discussed with pt recommendation that she speak to her cardiologist about HTN meds update to reduce the bradycardic effect.  Pt comfortable and asymptomatic, and agrees to contact her cardiologist.  No complications documented.  Raliegh Ip

## 2019-11-10 NOTE — Anesthesia Preprocedure Evaluation (Signed)
Anesthesia Evaluation  Patient identified by MRN, date of birth, ID band Patient awake    Reviewed: Allergy & Precautions, H&P , NPO status , Patient's Chart, lab work & pertinent test results  Airway Mallampati: II  TM Distance: >3 FB Neck ROM: full    Dental no notable dental hx.    Pulmonary COPD, former smoker,    Pulmonary exam normal breath sounds clear to auscultation       Cardiovascular hypertension, On Medications Normal cardiovascular exam Rhythm:regular Rate:Normal     Neuro/Psych negative neurological ROS     GI/Hepatic negative GI ROS, Neg liver ROS,   Endo/Other  negative endocrine ROS  Renal/GU negative Renal ROS  negative genitourinary   Musculoskeletal   Abdominal   Peds  Hematology negative hematology ROS (+)   Anesthesia Other Findings   Reproductive/Obstetrics                             Anesthesia Physical  Anesthesia Plan  ASA: III  Anesthesia Plan: MAC   Post-op Pain Management:    Induction:   PONV Risk Score and Plan: 2 and Treatment may vary due to age or medical condition, TIVA and Midazolam  Airway Management Planned:   Additional Equipment:   Intra-op Plan:   Post-operative Plan:   Informed Consent: I have reviewed the patients History and Physical, chart, labs and discussed the procedure including the risks, benefits and alternatives for the proposed anesthesia with the patient or authorized representative who has indicated his/her understanding and acceptance.     Dental Advisory Given  Plan Discussed with: CRNA  Anesthesia Plan Comments:         Anesthesia Quick Evaluation

## 2019-11-10 NOTE — H&P (Signed)
[facility]   Primary Care Physician:  [facility] Ophthalmologist: Dr. Willey Blade  Pre-Procedure History & Physical: HPI:  Natasha Walker is a 79 y.o. female here for cataract surgery.  She declined istent at previous clinic appointment.   Past Medical History:  Diagnosis Date  . COPD (chronic obstructive pulmonary disease) (HCC)   . Hypercholesteremia   . Hypertension   . TIA (transient ischemic attack)    several yrs ago. No deficits.  . Vertigo    none recently  . Wears dentures    partial upper    Past Surgical History:  Procedure Laterality Date  . ABDOMINAL HYSTERECTOMY    . BACK SURGERY    . CATARACT EXTRACTION W/PHACO Left 10/20/2019   Procedure: CATARACT EXTRACTION PHACO AND INTRAOCULAR LENS PLACEMENT (IOC) LEFT ISTENT INJ 7.13  00:42.2;  Surgeon: Nevada Crane, MD;  Location: [facility];  Service: Ophthalmology;  Laterality: Left;  . TONSILLECTOMY    . TUBAL LIGATION      Prior to Admission medications   Medication Sig Start Date End Date Taking? Authorizing Provider  albuterol (PROVENTIL HFA;VENTOLIN HFA) 108 (90 Base) MCG/ACT inhaler Inhale 2 puffs into the lungs every 4 (four) hours as needed for wheezing or shortness of breath. 08/03/15  Yes Hassan Rowan, MD  Ascorbic Acid (VITAMIN C) 1000 MG tablet Take 1,000 mg by mouth daily.   Yes [provider]  aspirin 81 MG tablet Take 81 mg by mouth daily.   Yes [provider]  Biotin 5000 MCG TABS Take by mouth daily.   Yes [provider]  calcium-vitamin D (OSCAL WITH D) 500-200 MG-UNIT tablet Take 1 tablet by mouth daily with breakfast.   Yes [provider]  carvedilol (COREG) 3.125 MG tablet Take 3.125 mg by mouth 2 (two) times daily with a meal.   Yes [provider]  DORZOLAMIDE HCL-TIMOLOL MAL OP Apply to eye in the morning and at bedtime.   Yes [provider]  felodipine (PLENDIL) 2.5 MG 24 hr tablet Take 10 mg by  mouth daily.    Yes [provider]  fluticasone (FLONASE) 50 MCG/ACT nasal spray Place 2 sprays into both nostrils daily.   Yes [provider]  ibuprofen (ADVIL) 400 MG tablet Take 400 mg by mouth every 6 (six) hours as needed.   Yes [provider]  losartan (COZAAR) 50 MG tablet Take 50 mg by mouth daily.   Yes [provider]  Magnesium 400 MG TABS Take by mouth daily.   Yes [provider]  meclizine (ANTIVERT) 25 MG tablet Take 25 mg by mouth 3 (three) times daily as needed for dizziness.    Yes [provider]  metroNIDAZOLE (METROGEL) 1 % gel Apply topically daily as needed.   Yes [provider]  montelukast (SINGULAIR) 10 MG tablet Take 10 mg by mouth daily as needed.   Yes [provider]  Travoprost, BAK Free, (TRAVATAN) 0.004 % SOLN ophthalmic solution 1 drop at bedtime.   Yes [provider]  VITAMIN A PO Take by mouth daily.   Yes [provider]  vitamin B-12 (CYANOCOBALAMIN) 1000 MCG tablet Take 1,000 mcg by mouth daily.   Yes [provider]  VITAMIN D PO Take by mouth daily.   Yes [provider]  vitamin E 180 MG (400 UNITS) capsule Take 400 Units by mouth daily.   Yes [provider]    Allergies as of 10/29/2019  . (  No Known Allergies)    History reviewed. No pertinent family history.  Social History   Socioeconomic History  . Marital status: Widowed    Spouse name: Not on file  . Number of children: Not on file  . Years of education: Not on file  . Highest education level: Not on file  Occupational History  . Not on file  Tobacco Use  . Smoking status: Former Games developer  . Smokeless tobacco: Never Used  . Tobacco comment: Socially as teenager  Vaping Use  . Vaping Use: Never used  Substance and Sexual Activity  . Alcohol use: No  . Drug use: No  . Sexual activity: Not on file  Other Topics Concern  . Not on file  Social History Narrative   . Not on file   Social Determinants of Health   Financial Resource Strain:   . Difficulty of Paying Living Expenses: Not on file  Food Insecurity:   . Worried About Programme researcher, broadcasting/film/video in the Last Year: Not on file  . Ran Out of Food in the Last Year: Not on file  Transportation Needs:   . Lack of Transportation (Medical): Not on file  . Lack of Transportation (Non-Medical): Not on file  Physical Activity:   . Days of Exercise per Week: Not on file  . Minutes of Exercise per Session: Not on file  Stress:   . Feeling of Stress : Not on file  Social Connections:   . Frequency of Communication with Friends and Family: Not on file  . Frequency of Social Gatherings with Friends and Family: Not on file  . Attends Religious Services: Not on file  . Active Member of Clubs or Organizations: Not on file  . Attends Banker Meetings: Not on file  . Marital Status: Not on file  Intimate Partner Violence:   . Fear of Current or Ex-Partner: Not on file  . Emotionally Abused: Not on file  . Physically Abused: Not on file  . Sexually Abused: Not on file    Review of Systems: See HPI, otherwise negative ROS  Physical Exam: BP 139/74   Pulse (!) 52   Temp (!) 97.3 F (36.3 C) (Temporal)   Resp 16   Ht 5' (1.524 m)   Wt 75.8 kg   SpO2 97%   BMI 32.61 kg/m  General:   Alert,  pleasant and cooperative in NAD Head:  Normocephalic and atraumatic.  Impression/Plan: MERRIT WAUGH is here for cataract surgery.  Risks, benefits, limitations, and alternatives regarding cataract surgery have been reviewed with the patient.  Questions have been answered.  All parties agreeable.   Willey Blade, MD  11/10/2019, 10:57 AM

## 2019-11-10 NOTE — Op Note (Signed)
OPERATIVE NOTE  Natasha Walker [id number] 11/10/2019   PREOPERATIVE DIAGNOSIS:  Nuclear sclerotic cataract right eye.  H25.11   POSTOPERATIVE DIAGNOSIS:    Nuclear sclerotic cataract right eye.     PROCEDURE:  Phacoemusification with posterior chamber intraocular lens placement of the right eye   LENS:   Implant Name Type Inv. Item Serial No. Manufacturer Lot No. LRB No. Used Action  LENS II EYHANCE 19.0 - [id number] Intraocular Lens LENS II EYHANCE 19.0 [id number] JOHNSON   Right 1 Implanted       Procedure(s) with comments: CATARACT EXTRACTION PHACO AND INTRAOCULAR LENS PLACEMENT (IOC) RIGHT (Right) - 5.52 0:44.0  DIBOO +19.0   ULTRASOUND TIME: 0 minutes 44 seconds.  CDE 5.52   SURGEON:  Willey Blade, MD, MPH  ANESTHESIOLOGIST: Anesthesiologist: Ranee Gosselin, MD CRNA: Michaele Offer, CRNA   ANESTHESIA:  Topical with tetracaine drops augmented with 1% preservative-free intracameral lidocaine.  ESTIMATED BLOOD LOSS: less than 1 mL.   COMPLICATIONS:  None.   DESCRIPTION OF PROCEDURE:  The patient was identified in the holding room and transported to the operating room and placed in the supine position under the operating microscope.  The right eye was identified as the operative eye and it was prepped and draped in the usual sterile ophthalmic fashion.   A 1.0 millimeter clear-corneal paracentesis was made at the 10:30 position. 0.5 ml of preservative-free 1% lidocaine with epinephrine was injected into the anterior chamber.  The anterior chamber was filled with Healon 5 viscoelastic.  A 2.4 millimeter keratome was used to make a near-clear corneal incision at the 8:00 position.  A curvilinear capsulorrhexis was made with a cystotome and capsulorrhexis forceps.  Balanced salt solution was used to hydrodissect and hydrodelineate the nucleus.   Phacoemulsification was then used in stop and chop fashion to remove the lens nucleus and epinucleus.  The remaining cortex was then  removed using the irrigation and aspiration handpiece. Healon was then placed into the capsular bag to distend it for lens placement.  A lens was then injected into the capsular bag.  The remaining viscoelastic was aspirated.   Wounds were hydrated with balanced salt solution.  The anterior chamber was inflated to a physiologic pressure with balanced salt solution.   Intracameral vigamox 0.1 mL undiluted was injected into the eye and a drop placed onto the ocular surface.  No wound leaks were noted.  The patient was taken to the recovery room in stable condition without complications of anesthesia or surgery  Willey Blade 11/10/2019, 11:30 AM

## 2019-11-11 ENCOUNTER — Encounter: Payer: Self-pay | Admitting: Ophthalmology

## 2020-02-19 ENCOUNTER — Other Ambulatory Visit: Payer: Self-pay

## 2020-02-19 ENCOUNTER — Encounter: Payer: Self-pay | Admitting: Emergency Medicine

## 2020-02-19 ENCOUNTER — Ambulatory Visit
Admission: EM | Admit: 2020-02-19 | Discharge: 2020-02-19 | Disposition: A | Discharge status: Home / Self Care | Payer: Medicare Other | Attending: Physician Assistant | Admitting: Physician Assistant

## 2020-02-19 DIAGNOSIS — R0981 Nasal congestion: Secondary | ICD-10-CM | POA: Diagnosis not present

## 2020-02-19 DIAGNOSIS — R059 Cough, unspecified: Secondary | ICD-10-CM | POA: Insufficient documentation

## 2020-02-19 DIAGNOSIS — J441 Chronic obstructive pulmonary disease with (acute) exacerbation: Secondary | ICD-10-CM | POA: Diagnosis not present

## 2020-02-19 DIAGNOSIS — Z20822 Contact with and (suspected) exposure to covid-19: Secondary | ICD-10-CM | POA: Diagnosis not present

## 2020-02-19 MED ORDER — ALBUTEROL SULFATE HFA 108 (90 BASE) MCG/ACT IN AERS: 1.0000 | INHALATION_SPRAY | RESPIRATORY_TRACT | 1 refills | Status: AC | PRN

## 2020-02-19 MED ORDER — PREDNISONE 20 MG PO TABS: 20.0000 mg | ORAL_TABLET | Freq: Every day | ORAL | 0 refills | Status: AC

## 2020-02-19 MED ORDER — CHERATUSSIN AC 100-10 MG/5ML PO SOLN
5.0000 mL | Freq: Four times a day (QID) | ORAL | 0 refills | Status: AC | PRN
Start: 2020-02-19 — End: 2020-02-26

## 2020-02-19 MED ORDER — AZITHROMYCIN 250 MG PO TABS: 250.0000 mg | ORAL_TABLET | Freq: Every day | ORAL | 0 refills | Status: AC

## 2020-02-19 NOTE — ED Triage Notes (Signed)
Patient c/o cough, nasal congestion and HAs that started Monday.  Patient denies fevers.

## 2020-02-19 NOTE — ED Provider Notes (Signed)
[facility]    CSN: [id number] Arrival date & time: 02/19/20  1352      History   Chief Complaint Chief Complaint  Patient presents with  . Cough  . Nasal Congestion    HPI Natasha Walker is a 80 y.o. female presenting for 3 day history of productive (yellow sputum) cough, congestion, fatigue, and headaches.  Patient denies any sick contacts.  Denies any known COVID-19 exposure.  Patient is fully vaccinated for COVID 19.  Patient reports a negative at home rapid COVID test yesterday.  Patient says she has been a little bit more short of breath than normal.  She says she needs a refill on her albuterol inhaler that she uses for COPD.  She denies any fever, chest pain, abdominal pain, nausea/vomiting or diarrhea.  No acute smell or taste changes.  Has been taking over-the-counter cough medication but says it has not really helped her cough.  Says she coughs every time she takes of breath or tries to talk.  Patient states that her cough is keeping her up in the evenings as well. Other PMH significant for hypertension, hyperlipidemia, and TIA.  Patient has no other complaints or concerns at this time. HPI  Past Medical History:  Diagnosis Date  . COPD (chronic obstructive pulmonary disease) (HCC)   . Hypercholesteremia   . Hypertension   . TIA (transient ischemic attack)    several yrs ago. No deficits.  . Vertigo    none recently  . Wears dentures    partial upper    There are no problems to display for this patient.   Past Surgical History:  Procedure Laterality Date  . ABDOMINAL HYSTERECTOMY    . BACK SURGERY    . CATARACT EXTRACTION W/PHACO Left 10/20/2019   Procedure: CATARACT EXTRACTION PHACO AND INTRAOCULAR LENS PLACEMENT (IOC) LEFT ISTENT INJ 7.13  00:42.2;  Surgeon: Nevada Crane, MD;  Location: [facility];  Service: Ophthalmology;  Laterality: Left;  . CATARACT EXTRACTION W/PHACO Right 11/10/2019   Procedure: CATARACT EXTRACTION PHACO AND  INTRAOCULAR LENS PLACEMENT (IOC) RIGHT;  Surgeon: Nevada Crane, MD;  Location: [facility];  Service: Ophthalmology;  Laterality: Right;  5.52 0:44.0  . TONSILLECTOMY    . TUBAL LIGATION      OB History   No obstetric history on file.      Home Medications    Prior to Admission medications   Medication Sig Start Date End Date Taking? Authorizing Provider  Ascorbic Acid (VITAMIN C) 1000 MG tablet Take 1,000 mg by mouth daily.   Yes [provider]  aspirin 81 MG tablet Take 81 mg by mouth daily.   Yes [provider]  atorvastatin (LIPITOR) 40 MG tablet Take by mouth. 04/07/16 11/10/20 Yes [provider]  azithromycin (ZITHROMAX) 250 MG tablet Take 1 tablet (250 mg total) by mouth daily. Take first 2 tablets together, then 1 every day until finished. 02/19/20  Yes Shirlee Latch, PA-C  Biotin 5000 MCG TABS Take by mouth daily.   Yes [provider]  calcium-vitamin D (OSCAL WITH D) 500-200 MG-UNIT tablet Take 1 tablet by mouth daily with breakfast.   Yes [provider]  carvedilol (COREG) 3.125 MG tablet Take 3.125 mg by mouth 2 (two) times daily with a meal.   Yes [provider]  felodipine (PLENDIL) 2.5 MG 24 hr tablet Take 10 mg by mouth daily.    Yes [provider]  fluticasone (FLONASE) 50 MCG/ACT  nasal spray Place 2 sprays into both nostrils daily.   Yes [provider]  guaiFENesin-codeine (CHERATUSSIN AC) 100-10 MG/5ML syrup Take 5 mLs by mouth 4 (four) times daily as needed for up to 7 days for cough. 02/19/20 02/26/20 Yes Shirlee LatchEaves, Jakeria Caissie B, PA-C  Magnesium 400 MG TABS Take by mouth daily.   Yes [provider]  montelukast (SINGULAIR) 10 MG tablet Take 10 mg by mouth daily as needed.   Yes [provider]  predniSONE (DELTASONE) 20 MG tablet Take 1 tablet (20 mg total) by mouth daily for 5 days. 02/19/20 02/24/20 Yes Shirlee LatchEaves, Monterius Rolf B, PA-C  telmisartan (MICARDIS) 80 MG tablet Take  by mouth. 01/26/20 01/25/21 Yes [provider]  tiZANidine (ZANAFLEX) 2 MG tablet TAKE 1 TABLET BY MOUTH AT BEDTIME AS NEEDED FOR MUSCLE SPASM 01/24/19  Yes [provider]  Travoprost, BAK Free, (TRAVATAN) 0.004 % SOLN ophthalmic solution 1 drop at bedtime.   Yes [provider]  vitamin E 180 MG (400 UNITS) capsule Take 400 Units by mouth daily.   Yes [provider]  albuterol (VENTOLIN HFA) 108 (90 Base) MCG/ACT inhaler Inhale 1-2 puffs into the lungs every 4 (four) hours as needed for wheezing or shortness of breath. 02/19/20 02/18/21  Eusebio FriendlyEaves, Kaileia Flow B, PA-C  DORZOLAMIDE HCL-TIMOLOL MAL OP Apply to eye in the morning and at bedtime.    [provider]  hydrochlorothiazide (HYDRODIURIL) 12.5 MG tablet Take 12.5 mg by mouth daily. 12/22/19   [provider]  ibuprofen (ADVIL) 400 MG tablet Take 400 mg by mouth every 6 (six) hours as needed.    [provider]  losartan (COZAAR) 50 MG tablet Take 50 mg by mouth daily.    [provider]  meclizine (ANTIVERT) 25 MG tablet Take 25 mg by mouth 3 (three) times daily as needed for dizziness.     [provider]  metroNIDAZOLE (METROGEL) 1 % gel Apply topically daily as needed.    [provider]  PREMARIN vaginal cream Place vaginally. 01/26/20   [provider]  VITAMIN A PO Take by mouth daily.    [provider]  vitamin B-12 (CYANOCOBALAMIN) 1000 MCG tablet Take 1,000 mcg by mouth daily.    [provider]  VITAMIN D PO Take by mouth daily.    [provider]    Family History History reviewed. No pertinent family history.  Social History Social History   Tobacco Use  . Smoking status: Former Games developermoker  . Smokeless tobacco: Never Used  . Tobacco comment: Socially as teenager  Vaping Use  . Vaping Use: Never used  Substance Use Topics  . Alcohol use: No  . Drug use: No     Allergies   Patient has no known  allergies.   Review of Systems Review of Systems  Constitutional: Positive for fatigue. Negative for chills, diaphoresis and fever.  HENT: Positive for congestion, rhinorrhea and sore throat. Negative for ear pain, sinus pressure and sinus pain.   Respiratory: Positive for cough and shortness of breath (mild). Negative for wheezing.   Cardiovascular: Negative for chest pain.  Gastrointestinal: Negative for abdominal pain, nausea and vomiting.  Musculoskeletal: Negative for arthralgias and myalgias.  Skin: Negative for rash.  Neurological: Positive for headaches. Negative for weakness.  Hematological: Negative for adenopathy.     Physical Exam Triage Vital Signs ED Triage Vitals  Enc Vitals Group     BP 02/19/20 1420 (!) 156/95     Pulse Rate  02/19/20 1420 90     Resp 02/19/20 1420 14     Temp 02/19/20 1420 98.6 F (37 C)     Temp Source 02/19/20 1420 Oral     SpO2 02/19/20 1420 99 %     Weight 02/19/20 1414 170 lb (77.1 kg)     Height 02/19/20 1414 5' (1.524 m)     Head Circumference --      Peak Flow --      Pain Score 02/19/20 1413 7     Pain Loc --      Pain Edu? --      Excl. in GC? --    No data found.  Updated Vital Signs BP (!) 150/90 (BP Location: Right Arm)   Pulse 90   Temp 98.6 F (37 C) (Oral)   Resp 14   Ht 5' (1.524 m)   Wt 170 lb (77.1 kg)   SpO2 99%   BMI 33.20 kg/m       Physical Exam Vitals and nursing note reviewed.  Constitutional:      General: She is not in acute distress.    Appearance: Normal appearance. She is not ill-appearing or toxic-appearing.  HENT:     Head: Normocephalic and atraumatic.     Nose: Congestion and rhinorrhea present.     Mouth/Throat:     Mouth: Mucous membranes are moist.     Pharynx: Oropharynx is clear.  Eyes:     General: No scleral icterus.       Right eye: No discharge.        Left eye: No discharge.     Conjunctiva/sclera: Conjunctivae normal.  Cardiovascular:     Rate and Rhythm: Normal rate  and regular rhythm.     Heart sounds: Normal heart sounds.  Pulmonary:     Effort: Pulmonary effort is normal. No respiratory distress.     Breath sounds: Rhonchi (few scattered rhonchi which clear with cough) present. No wheezing or rales.  Musculoskeletal:     Cervical back: Neck supple.  Skin:    General: Skin is dry.  Neurological:     General: No focal deficit present.     Mental Status: She is alert. Mental status is at baseline.     Motor: No weakness.     Gait: Gait normal.  Psychiatric:        Mood and Affect: Mood normal.        Behavior: Behavior normal.        Thought Content: Thought content normal.      UC Treatments / Results  Labs (all labs ordered are listed, but only abnormal results are displayed) Labs Reviewed  SARS CORONAVIRUS 2 (TAT 6-24 HRS)    EKG   Radiology No results found.  Procedures Procedures (including critical care time)  Medications Ordered in UC Medications - No data to display  Initial Impression / Assessment and Plan / UC Course  I have reviewed the triage vital signs and the nursing notes.  Pertinent labs & imaging results that were available during my care of the patient were reviewed by me and considered in my medical decision making (see chart for details).    Send out COVID testing obtained.  Patient aware how to access results.  Current CDC guidance, isolation protocol and ED precautions reviewed with patient.  Advised supportive care with increasing rest and fluids.  I refilled her albuterol inhaler.  I also sent Cheratussin to pharmacy and azithromycin to cover for COPD  exacerbation.  Blood pressure is elevated 150/90 so I have sent in 20 mg prednisone daily x5 days instead of 40 mg.  Advised her to follow-up with our department or emergency department if she has any new or worsening symptoms especially if she has any increased breathing difficulty.  Patient agreeable.   Final Clinical Impressions(s) / UC Diagnoses    Final diagnoses:  COPD exacerbation (HCC)  Cough  Nasal congestion     Discharge Instructions     You have received COVID testing today either for positive exposure, concerning symptoms that could be related to COVID infection, screening purposes, or re-testing after confirmed positive.  Your test obtained today checks for active viral infection in the last 1-2 weeks. If your test is negative now, you can still test positive later. So, if you do develop symptoms you should either get re-tested and/or isolate x 5 days and then strict mask use x 5 days (unvaccinated) or mask use x 10 days (vaccinated). Please follow CDC guidelines.  While Rapid antigen tests come back in 15-20 minutes, send out PCR/molecular test results typically come back within 1-3 days. In the mean time, if you are symptomatic, assume this could be a positive test and treat/monitor yourself as if you do have COVID.   We will call with test results if positive. Please download the MyChart app and set up a profile to access test results.   If symptomatic, go home and rest. Push fluids. Take Tylenol as needed for discomfort. Gargle warm salt water. Throat lozenges. Take Mucinex DM or Robitussin for cough. Humidifier in bedroom to ease coughing. Warm showers. Also review the COVID handout for more information.  COVID-19 INFECTION: The incubation period of COVID-19 is approximately 14 days after exposure, with most symptoms developing in roughly 4-5 days. Symptoms may range in severity from mild to critically severe. Roughly 80% of those infected will have mild symptoms. People of any age may become infected with COVID-19 and have the ability to transmit the virus. The most common symptoms include: fever, fatigue, cough, body aches, headaches, sore throat, nasal congestion, shortness of breath, nausea, vomiting, diarrhea, changes in smell and/or taste.    COURSE OF ILLNESS Some patients may begin with mild disease which can  progress quickly into critical symptoms. If your symptoms are worsening please call ahead to the Emergency Department and proceed there for further treatment. Recovery time appears to be roughly 1-2 weeks for mild symptoms and 3-6 weeks for severe disease.   GO IMMEDIATELY TO ER FOR FEVER YOU ARE UNABLE TO GET DOWN WITH TYLENOL, BREATHING PROBLEMS, CHEST PAIN, FATIGUE, LETHARGY, INABILITY TO EAT OR DRINK, ETC  QUARANTINE AND ISOLATION: To help decrease the spread of COVID-19 please remain isolated if you have COVID infection or are highly suspected to have COVID infection. This means -stay home and isolate to one room in the home if you live with others. Do not share a bed or bathroom with others while ill, sanitize and wipe down all countertops and keep common areas clean and disinfected. Stay home for 5 days. If you have no symptoms or your symptoms are resolving after 5 days, you can leave your house. Continue to wear a mask around others for 5 additional days. If you have been in close contact (within 6 feet) of someone diagnosed with COVID 19, you are advised to quarantine in your home for 14 days as symptoms can develop anywhere from 2-14 days after exposure to the virus. If you develop  symptoms, you  must isolate.  Most current guidelines for COVID after exposure -unvaccinated: isolate 5 days and strict mask use x 5 days. Test on day 5 is possible -vaccinated: wear mask x 10 days if symptoms do not develop -You do not necessarily need to be tested for COVID if you have + exposure and  develop symptoms. Just isolate at home x10 days from symptom onset During this global pandemic, CDC advises to practice social distancing, try to stay at least 556ft away from others at all times. Wear a face covering. Wash and sanitize your hands regularly and avoid going anywhere that is not necessary.  KEEP IN MIND THAT THE COVID TEST IS NOT 100% ACCURATE AND YOU SHOULD STILL DO EVERYTHING TO PREVENT POTENTIAL  SPREAD OF VIRUS TO OTHERS (WEAR MASK, WEAR GLOVES, WASH HANDS AND SANITIZE REGULARLY). IF INITIAL TEST IS NEGATIVE, THIS MAY NOT MEAN YOU ARE DEFINITELY NEGATIVE. MOST ACCURATE TESTING IS DONE 5-7 DAYS AFTER EXPOSURE.   It is not advised by CDC to get re-tested after receiving a positive COVID test since you can still test positive for weeks to months after you have already cleared the virus.   *If you have not been vaccinated for COVID, I strongly suggest you consider getting vaccinated as long as there are no contraindications.      ED Prescriptions    Medication Sig Dispense Auth. Provider   albuterol (VENTOLIN HFA) 108 (90 Base) MCG/ACT inhaler Inhale 1-2 puffs into the lungs every 4 (four) hours as needed for wheezing or shortness of breath. 1 each Shirlee LatchEaves, Levert Heslop B, PA-C   azithromycin (ZITHROMAX) 250 MG tablet Take 1 tablet (250 mg total) by mouth daily. Take first 2 tablets together, then 1 every day until finished. 6 tablet Eusebio FriendlyEaves, Adryen Cookson B, PA-C   guaiFENesin-codeine (CHERATUSSIN AC) 100-10 MG/5ML syrup Take 5 mLs by mouth 4 (four) times daily as needed for up to 7 days for cough. 120 mL Eusebio FriendlyEaves, Nuha Degner B, PA-C   predniSONE (DELTASONE) 20 MG tablet Take 1 tablet (20 mg total) by mouth daily for 5 days. 5 tablet Gareth MorganEaves, Dru Primeau B, PA-C     PDMP not reviewed this encounter.   Shirlee Latchaves, Janeil Schexnayder B, PA-C 02/19/20 [id number]

## 2020-02-19 NOTE — Discharge Instructions (Signed)

## 2020-02-20 LAB — SARS CORONAVIRUS 2 (TAT 6-24 HRS): SARS Coronavirus 2: NEGATIVE

## 2020-06-27 DIAGNOSIS — M1712 Unilateral primary osteoarthritis, left knee: Secondary | ICD-10-CM | POA: Insufficient documentation

## 2020-07-13 ENCOUNTER — Other Ambulatory Visit: Payer: Self-pay

## 2020-07-13 ENCOUNTER — Encounter: Payer: Self-pay | Admitting: Physical Therapy

## 2020-07-13 ENCOUNTER — Ambulatory Visit: Payer: Medicare Other | Attending: Physician Assistant | Admitting: Physical Therapy

## 2020-07-13 DIAGNOSIS — M6281 Muscle weakness (generalized): Secondary | ICD-10-CM | POA: Diagnosis present

## 2020-07-13 DIAGNOSIS — R262 Difficulty in walking, not elsewhere classified: Secondary | ICD-10-CM | POA: Diagnosis present

## 2020-07-13 DIAGNOSIS — M25561 Pain in right knee: Secondary | ICD-10-CM | POA: Diagnosis present

## 2020-07-13 DIAGNOSIS — G8929 Other chronic pain: Secondary | ICD-10-CM

## 2020-07-13 DIAGNOSIS — M25562 Pain in left knee: Secondary | ICD-10-CM | POA: Diagnosis not present

## 2020-07-13 NOTE — Therapy (Signed)
[facility] [address] [place], [place], [address] Phone: [phone]   Fax:  [phone]  Physical Therapy Evaluation  Patient Details  Name: Natasha Walker MRN: [id number] Date of Birth: 1940/04/14 Referring Provider (PT): Erskine Speed, [place]   Encounter Date: 07/13/2020   PT End of Session - 07/14/20 1837    Visit Number 1    Number of Visits 17    Date for PT Re-Evaluation 09/07/20    Authorization Time Period IE 07/13/2020    Authorization - Visit Number 1    Authorization - Number of Visits 10    Progress Note Due on Visit 10    PT Start Time 0100    PT Stop Time 0145    PT Time Calculation (min) 45 min    Activity Tolerance Patient tolerated treatment well    Behavior During Therapy [facility] for tasks assessed/performed           Past Medical History:  Diagnosis Date  . COPD (chronic obstructive pulmonary disease) (HCC)   . Hypercholesteremia   . Hypertension   . TIA (transient ischemic attack)    several yrs ago. No deficits.  . Vertigo    none recently  . Wears dentures    partial upper    Past Surgical History:  Procedure Laterality Date  . ABDOMINAL HYSTERECTOMY    . BACK SURGERY    . CATARACT EXTRACTION W/PHACO Left 10/20/2019   Procedure: CATARACT EXTRACTION PHACO AND INTRAOCULAR LENS PLACEMENT (IOC) LEFT ISTENT INJ 7.13  00:42.2;  Surgeon: Nevada Crane, MD;  Location: [facility];  Service: Ophthalmology;  Laterality: Left;  . CATARACT EXTRACTION W/PHACO Right 11/10/2019   Procedure: CATARACT EXTRACTION PHACO AND INTRAOCULAR LENS PLACEMENT (IOC) RIGHT;  Surgeon: Nevada Crane, MD;  Location: [facility];  Service: Ophthalmology;  Laterality: Right;  5.52 0:44.0  . TONSILLECTOMY    . TUBAL LIGATION      There were no vitals filed for this visit.    Subjective Assessment - 07/13/20 1300    Subjective Bilateral knee OA, comorbid LBP with Hx of fusion 5 years ago     Pertinent History Patient is a 80 year old female referred for bilateral knee osteoarthritis. Patient thought her main issue was her back. She has Hx of lumbar spine fusion about 5 years ago. Patient reports having difficulty with getting around, and she thought her back was the main issue at the time. Patient reports minor pain affecting her back and bilateral knees at this time. Patient reports some cramping in her calves; patient does not report numbness/paresthesias. Patient reports she has been dealing with pain for about 1 year. She reports working on exercise e.g. using bike to improve pain. No recent trauma. No surgical history in bilateral knees. Worse in the evenings.    Limitations Standing;Other (comment)   sit to stand   Diagnostic tests No recent imaging on hip or lumbar spine. Recent knee radiographs demonstrating degenerative change.    Patient Stated Goals Improved ability to "get around"    Currently in Pain? Yes    Pain Score 4     Pain Location Knee    Pain Orientation Left;Right    Pain Descriptors / Indicators Nagging    Aggravating Factors  sit to stand, prolonged sitting, lying    Pain Relieving Factors cortisone injections, topical medication, feet elevated/on recliner    Multiple Pain Sites Yes    Pain Score 4  Pain Location Back    Pain Descriptors / Indicators Nagging    Pain Relieving Factors heat, topical medication              [facility] PT Assessment - 07/14/20 0001      Assessment   Medical Diagnosis bilateral knee osteoarthritis    Referring Provider (PT) Erskine Speed, PA    Onset Date/Surgical Date 07/15/19    Prior Therapy Multiple episodes of PT      Balance Screen   Has the patient fallen in the past 6 months No      Home Environment   Living Environment Private residence    Living Arrangements Alone   Husband passed away   Home Layout One level    Additional Comments Small curb to step onto carport, 2-3 steps to enter home with handrail  on each side            SUBJECTIVE Chief complaint: Bilateral knee OA, comorbid LBP 5 yrs s/p lumbar fusion Onset: 1 year ago Referring Dx: bilateral knee osteoarthritis Referring provider: Richarda Osmond, PA 24 hour pain behavior: worse in evening Dominant hand: Right handed Imaging: Yes Radiographs, bilateral knees (image on cell phone) Decreased joint space, bilateral degenerative changes  OBJECTIVE  MUSCULOSKELETAL: Tremor: Absent Bulk: Normal Tone: Normal, no spasticity, rigidity, or clonus Moderate infrapatellar edema bilaterally.  Posture Mild R lateral shift in standing, forward head rounded shoulders posture  Lumbar/Hip AROM: Flexion moderately limited, minimal extension AROM; sidebending AROM WFL with mild contralateral flank pull/discomfort  Gait Mild R lateral shift at lumbar spine, decreased L weight shift during stance phase   Palpation Tenderness to palpation along medial joint line, distal quadriceps mm bilaterally  Strength R/L 4-/4- Hip flexion 4/4 Hip abduction 5/5 Hip adduction 4-*/4- Knee extension 4-/4- Knee flexion 4+/5 Ankle Dorsiflexion 4+/5 Ankle Plantarflexion 4/4 Ankle Inversion 4/4 Ankle Eversion *indicates pain  AROM  Knee R/L Flexion: 130/110* Extension: 0/0 *indicates pain  Ankle R/L 50/50 Ankle Plantarflexion 8/5 Ankle Dorsiflexion *Indicates Pain  Passive Accessory Motion Superior Tibiofibular Joint: WNL Knee: Hypomobile posterior glide tibiofemoral joint  Patella: hypomobile in all directions  Ankle: decreased dorsiflexion mobility   NEUROLOGICAL:  Mental Status Patient is oriented to person, place and time.  Recent memory is intact.  Remote memory is intact.  Attention span and concentration are intact.  Expressive speech is intact.  Patient's fund of knowledge is within normal limits for educational level.  Sensation Deferred  Reflexes Deferred  FUNCTIONAL TESTS  Sit to stand: Positive Heavy  UE for initiating sit to stand to lift pelvis, decreased trunk flexion to initiate sit to stand   Objective measurements completed on examination: See above findings.       PT Education - 07/14/20 1850    Education Details Patient education on current condition, role of PT, prognosis, plan of care. Patient educated on HEP with demo and performance of exercises in clinic with handout provided. Access Code [employer]    Person(s) Educated Patient    Methods Explanation;Handout;Verbal cues;Demonstration;Tactile cues    Comprehension Returned demonstration;Verbalized understanding            PT Short Term Goals - 07/14/20 1823      PT SHORT TERM GOAL #1   Title Patient will be indepedent and 100% compliant with established HEP and activity modification as needed to augment PT intervention and improve strength as needed for accessing commuinty    Baseline HEP given at IE 07/14/2020    Time 3  Period Weeks    Status New    Target Date 08/03/20      PT SHORT TERM GOAL #2   Title Patient will have full knee ROM bilaterally without reproduction of pain as needed for managing lower extremity during bed mobility and self-care activities e.g. dressing, bathing    Baseline L knee flexion 110    Time 3    Period Weeks    Status New    Target Date 08/03/20             PT Long Term Goals - 07/13/20 1754      PT LONG TERM GOAL #1   Title Patient will demonstrate improved function as evidenced by a score of 54 on FOTO measure for full participation in activities at home and in the community.    Baseline FOTO 40 at IE 07/13/2020    Time 8    Period Weeks    Status New    Target Date 09/07/20      PT LONG TERM GOAL #2   Title Patient will perform sit to stand without UE support and no reproduction of pain as needed for transferring and home and community-level mobility    Baseline heavy UE support and difficulty with initiating lift-off of pelvis during sit to stand    Time 8     Period Weeks    Status New    Target Date 09/07/20      PT LONG TERM GOAL #3   Title Patient will have MMT4+/5 or greater for all tested LE musculature indicative of improved strength as needed for ability to perform transferring, stair/obstacle negotiation, and for ability to produce power sufficient for prevention of fall in event of large postural perturbation    Baseline MMT 4- hip flexion, quad, HS. MMT 4 for hip abduction, ankle inversion and eversion    Time 8    Period Weeks    Status New    Target Date 09/07/20      PT LONG TERM GOAL #4   Title Patient will independently perform stair ascent/descent with reciprocal stepping pattern and unilateral upper extremity support mimicking entering/existing home (3 steps to enter/exit) with no LOB/knee buckling    Baseline Difficulty with negotiating step-up onto carport and stairs to enter/exit home    Time 8    Period Weeks    Status New    Target Date 09/07/20              Plan - 07/14/20 1838    Clinical Impression Statement Pt is a pleasant 80 year old female referred for bilateral knee OA with current activity limitations in transferring, gait, stair/curb negotiation, sitting, lying positions. PT examination reveals deficits in L knee flexion ROM, patellar and tibiofemoral mobility, decreased hip/knee/ankle mm strength, ankle dorsiflexion stiffness. Pt's prognosis is limited by comorbid COPD, HTN, hx of TIA, age. Pt will benefit from PT services to address deficits in strength, mobility, and pain in order to return to full function at home and in community with less knee pain.    Personal Factors and Comorbidities Age;Comorbidity 3+;Time since onset of injury/illness/exacerbation;Other   Patient lives alone   Examination-Activity Limitations Bed Mobility;Transfers;Sit;Locomotion Level;Stairs    Examination-Participation Restrictions Community Activity;Interpersonal Relationship;Shop    Stability/Clinical Decision Making  Evolving/Moderate complexity    Clinical Decision Making Moderate    Rehab Potential Good    PT Frequency 2x / week    PT Duration 8 weeks    PT Treatment/Interventions Therapeutic activities;Therapeutic exercise;Neuromuscular  re-education;Patient/family education;Manual techniques;Passive range of motion;Gait training    PT Next Visit Plan Continue with work on patellar/tibiofemoral mobility deficits, STM, knee ROM, LE strengthening, gentle L-spine mobility work    PT Home Exercise Plan HEP provided - Access Code [email or web address]    Consulted and Agree with Plan of Care Patient           Patient will benefit from skilled therapeutic intervention in order to improve the following deficits and impairments:  Hypomobility,Decreased strength,Decreased range of motion,Difficulty walking,Pain  Visit Diagnosis: Chronic pain of left knee  Chronic pain of right knee  Muscle weakness (generalized)  Difficulty in walking, not elsewhere classified     Problem List There are no problems to display for this patient.  Consuela MimesJeremy Janey Petron, PT, DPT [id number] Gertie ExonJeremy T Kenzi Bardwell 07/14/2020, 6:54 PM  [facility] [address] [place], [place], [address] Phone: [phone]   Fax:  [phone]  Name: Natasha Walker MRN: [id number] Date of Birth: 05-Jun-1940

## 2020-07-19 ENCOUNTER — Other Ambulatory Visit: Payer: Self-pay

## 2020-07-19 ENCOUNTER — Ambulatory Visit: Payer: Medicare Other | Admitting: Physical Therapy

## 2020-07-19 VITALS — BP 122/64 | HR 64

## 2020-07-19 DIAGNOSIS — G8929 Other chronic pain: Secondary | ICD-10-CM

## 2020-07-19 DIAGNOSIS — M6281 Muscle weakness (generalized): Secondary | ICD-10-CM

## 2020-07-19 DIAGNOSIS — M25562 Pain in left knee: Secondary | ICD-10-CM | POA: Diagnosis not present

## 2020-07-19 DIAGNOSIS — R262 Difficulty in walking, not elsewhere classified: Secondary | ICD-10-CM

## 2020-07-19 NOTE — Therapy (Signed)
[facility] [address] [place], [place], [address] Phone: [phone]   Fax:  [phone]  Physical Therapy Treatment  Patient Details  Name: Natasha Walker MRN: [id number] Date of Birth: Jun 02, 1940 Referring Provider (PT): Erskine Speed, [place]   Encounter Date: 07/19/2020   PT End of Session - 07/19/20 1022     Visit Number 2    Number of Visits 17    Date for PT Re-Evaluation 09/07/20    Authorization Time Period IE 07/13/2020    Authorization - Visit Number 2    Authorization - Number of Visits 10    Progress Note Due on Visit 10    PT Start Time 0757    PT Stop Time 0843    PT Time Calculation (min) 46 min    Activity Tolerance Patient tolerated treatment well    Behavior During Therapy [facility] for tasks assessed/performed             Past Medical History:  Diagnosis Date   COPD (chronic obstructive pulmonary disease) (HCC)    Hypercholesteremia    Hypertension    TIA (transient ischemic attack)    several yrs ago. No deficits.   Vertigo    none recently   Wears dentures    partial upper    Past Surgical History:  Procedure Laterality Date   ABDOMINAL HYSTERECTOMY     BACK SURGERY     CATARACT EXTRACTION W/PHACO Left 10/20/2019   Procedure: CATARACT EXTRACTION PHACO AND INTRAOCULAR LENS PLACEMENT (IOC) LEFT ISTENT INJ 7.13  00:42.2;  Surgeon: Nevada Crane, MD;  Location: [facility];  Service: Ophthalmology;  Laterality: Left;   CATARACT EXTRACTION W/PHACO Right 11/10/2019   Procedure: CATARACT EXTRACTION PHACO AND INTRAOCULAR LENS PLACEMENT (IOC) RIGHT;  Surgeon: Nevada Crane, MD;  Location: [facility];  Service: Ophthalmology;  Laterality: Right;  5.52 0:44.0   TONSILLECTOMY     TUBAL LIGATION      Vitals:   07/19/20 0800 07/19/20 0836  BP: 137/73 122/64  Pulse: 93 64     Subjective Assessment - 07/19/20 0757     Subjective Patient reports having rapid HR this AM, 96  bpm. She reports her BP was good this AM. She denies significant pain at arrival to PT. She reports compliance with her HEP. Patient denies palpitations, syncope, dizziness, cold sweats or diaphoresis, or other major changes. She states she did forget to take one of her medications yesterday.    Pertinent History Patient is a 80 year old female referred for bilateral knee osteoarthritis. Patient thought her main issue was her back. She has Hx of lumbar spine fusion about 5 years ago. Patient reports having difficulty with getting around, and she thought her back was the main issue at the time. Patient reports minor pain affecting her back and bilateral knees at this time. Patient reports some cramping in her calves; patient does not report numbness/paresthesias. Patient reports she has been dealing with pain for about 1 year. She reports working on exercise e.g. using bike to improve pain. No recent trauma. No surgical history in bilateral knees. Worse in the evenings.    Limitations Standing;Other (comment)   sit to stand   Diagnostic tests No recent imaging on hip or lumbar spine. Recent knee radiographs demonstrating degenerative change.    Patient Stated Goals Improved ability to "get around"    Currently in Pain? No/denies            Objective  Findings Knee PROM: R WNL, L 0-122   Treatment Performed  Manual Therapy - for symptom modulation and soft tissue mobility/extensibility, joint accessory motion, patellar mobility, knee complex ROM  Patellar mobilization, gr III, all planes; bilaterally, emphasis on inferior glide STM vastus medialis bilaterally Tibiofemoral posterior mobilization gr II-III for pain control and joint mobility  Passive knee ROM within patient tolerance (L>RLE)   Therapeutic exercise - exercises to address ROM deficits, strengthening as needed for carryover to performance of transferring/stair negotiation/functional activities  Heel slide; x20, bilat Straight  leg raise; supine with opposite knee flexed, 2x7, bilat (patient significantly fatigued at 7 repetitions consistently) Bridge; x10  Review of existing HEP, use of cycling equipment at home   Therapeutic activities - patient education, data gathering for patient's vital signs and S&S; functional activities to perform task performance during ADLs  -Measurement of BP and HR x 2, monitoring S&S -Standing toe tap; 2x10 alternating, on 6-inch step, hands near bilat handrails; standby assist -Standing heel raise; 2x10, light support on bilat UE handrails      ASSESSMENT Patient reports higher than normal resting heart rate this AM. Patient does have above-normal resting heart rate at arrival, but not severe tachycardia to contraindicate exercise or physical activity. Educated patient at length on signs and symptoms warranting immediate medical attention. Pt keeps log on vital sign readings each day; encouraged patient to continue this log and she will follow-up with her MD regarding resting HR. Patient does have good blood pressure and HR reading following exercise today. Monitored patient's signs and symptoms throughout session. Patient exhibits improved L knee flexion ROM today following manual therapy and active mobility work. Patient has no remarkable pain during today's session and is appropriate for progression of low-impact strengthening and CKC exercise with successive sessions pending WFL vitals and no cardiac S&S. Pt will benefit from continued skilled PT intervention to address pain, mobility deficits, strength, and ability to perform transferring tasks as needed for best return of function and maintenance of QoL.     PT Education - 07/19/20 1007     Education Details Discussed at length with patient signs and sypmtoms warranting immediate medical attention. Recommended discussion with MD regarding heightened HR at rest to discuss modification to Rx dosage prn. Discussed with patient normal  ranges for resting HR and blood pressure.    Person(s) Educated Patient    Methods Explanation    Comprehension Verbalized understanding              PT Short Term Goals - 07/14/20 1823       PT SHORT TERM GOAL #1   Title Patient will be indepedent and 100% compliant with established HEP and activity modification as needed to augment PT intervention and improve strength as needed for accessing commuinty    Baseline HEP given at IE 07/14/2020    Time 3    Period Weeks    Status New    Target Date 08/03/20      PT SHORT TERM GOAL #2   Title Patient will have full knee ROM bilaterally without reproduction of pain as needed for managing lower extremity during bed mobility and self-care activities e.g. dressing, bathing    Baseline L knee flexion 110    Time 3    Period Weeks    Status New    Target Date 08/03/20               PT Long Term Goals - 07/13/20 1754  PT LONG TERM GOAL #1   Title Patient will demonstrate improved function as evidenced by a score of 54 on FOTO measure for full participation in activities at home and in the community.    Baseline FOTO 40 at IE 07/13/2020    Time 8    Period Weeks    Status New    Target Date 09/07/20      PT LONG TERM GOAL #2   Title Patient will perform sit to stand without UE support and no reproduction of pain as needed for transferring and home and community-level mobility    Baseline heavy UE support and difficulty with initiating lift-off of pelvis during sit to stand    Time 8    Period Weeks    Status New    Target Date 09/07/20      PT LONG TERM GOAL #3   Title Patient will have MMT4+/5 or greater for all tested LE musculature indicative of improved strength as needed for ability to perform transferring, stair/obstacle negotiation, and for ability to produce power sufficient for prevention of fall in event of large postural perturbation    Baseline MMT 4- hip flexion, quad, HS. MMT 4 for hip abduction, ankle  inversion and eversion    Time 8    Period Weeks    Status New    Target Date 09/07/20      PT LONG TERM GOAL #4   Title Patient will independently perform stair ascent/descent with reciprocal stepping pattern and unilateral upper extremity support mimicking entering/existing home (3 steps to enter/exit) with no LOB/knee buckling    Baseline Difficulty with negotiating step-up onto carport and stairs to enter/exit home    Time 8    Period Weeks    Status New    Target Date 09/07/20                   Plan - 07/19/20 1022     Clinical Impression Statement Patient reports higher than normal resting heart rate this AM. Patient does have above-normal resting heart rate at arrival, but not severe tachycardia to contraindicate exercise or physical activity. Educated patient at length on signs and symptoms warranting immediate medical attention. Pt keeps log on vital sign readings each day; encouraged patient to continue this log and she will follow-up with her MD regarding resting HR. Patient does have good blood pressure and HR reading following exercise today. Monitored patient's signs and symptoms throughout session. Patient exhibits improved L knee flexion ROM today following manual therapy and active mobility work. Patient has no remarkable pain during today's session and is appropriate for progression of low-impact strengthening and CKC exercise with successive sessions pending WFL vitals and no cardiac S&S. Pt will benefit from continued skilled PT intervention to address pain, mobility deficits, strength, and ability to perform transferring tasks as needed for best return of function and maintenance of QoL.    Personal Factors and Comorbidities Age;Comorbidity 3+;Time since onset of injury/illness/exacerbation;Other   Patient lives alone   Examination-Activity Limitations Bed Mobility;Transfers;Sit;Locomotion Level;Stairs    Examination-Participation Restrictions Community  Activity;Interpersonal Relationship;Shop    Stability/Clinical Decision Making Evolving/Moderate complexity    Rehab Potential Good    PT Frequency 2x / week    PT Duration 8 weeks    PT Treatment/Interventions Therapeutic activities;Therapeutic exercise;Neuromuscular re-education;Patient/family education;Manual techniques;Passive range of motion;Gait training    PT Next Visit Plan Continue with work on patellar/tibiofemoral mobility deficits, STM, knee ROM, LE strengthening, gentle L-spine mobility work  PT Home Exercise Plan HEP provided - Access Code [email or web address]    Consulted and Agree with Plan of Care Patient             Patient will benefit from skilled therapeutic intervention in order to improve the following deficits and impairments:  Hypomobility, Decreased strength, Decreased range of motion, Difficulty walking, Pain  Visit Diagnosis: Chronic pain of left knee  Chronic pain of right knee  Muscle weakness (generalized)  Difficulty in walking, not elsewhere classified     Problem List There are no problems to display for this patient.  Consuela Mimes, PT, DPT [id number] Gertie Exon 07/19/2020, 12:31 PM  [facility] [address] [place], [place], [address] Phone: [phone]   Fax:  [phone]  Name: SALIMATOU SIMONE MRN: [id number] Date of Birth: 30-Mar-1940

## 2020-07-21 ENCOUNTER — Other Ambulatory Visit: Payer: Self-pay

## 2020-07-21 ENCOUNTER — Encounter: Payer: Self-pay | Admitting: Physical Therapy

## 2020-07-21 ENCOUNTER — Ambulatory Visit: Payer: Medicare Other | Admitting: Physical Therapy

## 2020-07-21 VITALS — BP 127/75 | HR 80

## 2020-07-21 DIAGNOSIS — G8929 Other chronic pain: Secondary | ICD-10-CM

## 2020-07-21 DIAGNOSIS — M25562 Pain in left knee: Secondary | ICD-10-CM

## 2020-07-21 DIAGNOSIS — M6281 Muscle weakness (generalized): Secondary | ICD-10-CM

## 2020-07-21 DIAGNOSIS — R262 Difficulty in walking, not elsewhere classified: Secondary | ICD-10-CM

## 2020-07-21 NOTE — Therapy (Signed)
[facility] [address] [place], [place], [address] Phone: [phone]   Fax:  [phone]  Physical Therapy Treatment  Patient Details  Name: Natasha KannerVivian G Walker MRN: [id number] Date of Birth: 20-Mar-1940 Referring Provider (PT): Erskine SpeedJennifer A Chazan, [place]   Encounter Date: 07/21/2020   PT End of Session - 07/21/20 0852     Visit Number 3    Number of Visits 17    Date for PT Re-Evaluation 09/07/20    Authorization Time Period IE 07/13/2020    Authorization - Visit Number 3    Authorization - Number of Visits 10    Progress Note Due on Visit 10    PT Start Time 0807    PT Stop Time 0845    PT Time Calculation (min) 38 min    Activity Tolerance Patient tolerated treatment well    Behavior During Therapy [facility] for tasks assessed/performed             Past Medical History:  Diagnosis Date   COPD (chronic obstructive pulmonary disease) (HCC)    Hypercholesteremia    Hypertension    TIA (transient ischemic attack)    several yrs ago. No deficits.   Vertigo    none recently   Wears dentures    partial upper    Past Surgical History:  Procedure Laterality Date   ABDOMINAL HYSTERECTOMY     BACK SURGERY     CATARACT EXTRACTION W/PHACO Left 10/20/2019   Procedure: CATARACT EXTRACTION PHACO AND INTRAOCULAR LENS PLACEMENT (IOC) LEFT ISTENT INJ 7.13  00:42.2;  Surgeon: Nevada CraneKing, Bradley Mark, MD;  [facility];  Service: Ophthalmology;  Laterality: Left;   CATARACT EXTRACTION W/PHACO Right 11/10/2019   Procedure: CATARACT EXTRACTION PHACO AND INTRAOCULAR LENS PLACEMENT (IOC) RIGHT;  Surgeon: Nevada CraneKing, Bradley Mark, MD;  [facility];  Service: Ophthalmology;  Laterality: Right;  5.52 0:44.0   TONSILLECTOMY     TUBAL LIGATION      Vitals:   07/21/20 0811  BP: 127/75  Pulse: 80     Subjective Assessment - 07/21/20 0806     Subjective Patient reports checking her HR the last 2 morning and it read 65 and  67 bpm. Patient reports concern over abrasion on L proximal shin region. Patient reports feeling generally well and not having S&S discussed at last session that would necessitate immediate medial attention. Patient reports moderate low back and knee pain at arrival. Patient is compliant with her home exercise program and is using stationary bike at home.    Pertinent History Patient is a 80 year old female referred for bilateral knee osteoarthritis. Patient thought her main issue was her back. She has Hx of lumbar spine fusion about 5 years ago. Patient reports having difficulty with getting around, and she thought her back was the main issue at the time. Patient reports minor pain affecting her back and bilateral knees at this time. Patient reports some cramping in her calves; patient does not report numbness/paresthesias. Patient reports she has been dealing with pain for about 1 year. She reports working on exercise e.g. using bike to improve pain. No recent trauma. No surgical history in bilateral knees. Worse in the evenings.    Limitations Standing;Other (comment)   sit to stand   Diagnostic tests No recent imaging on hip or lumbar spine. Recent knee radiographs demonstrating degenerative change.    Patient Stated Goals Improved ability to "get around"    Currently in Pain? Yes  Pain Score 4     Pain Location Knee    Pain Orientation Left;Right    Multiple Pain Sites Yes    Pain Score 4    Pain Location Back              Objective Findings Knee PROM: R WNL, L WNL  Abrasion along proximal L shin: no sign of infection, mild ecchymosis inferior to abrasion.     Treatment Performed   Manual Therapy - for symptom modulation and soft tissue mobility/extensibility, joint accessory motion, patellar mobility, knee complex ROM   Patellar mobilization, gr III, all planes; bilaterally, emphasis on inferior glide STM vastus medialis and lateralis bilaterally Tibiofemoral posterior  mobilization gr II-III for pain control and joint mobility Passive knee ROM within patient tolerance (L>RLE)     Therapeutic exercise - exercises to address ROM deficits, strengthening as needed for carryover to performance of transferring/stair negotiation/functional activities, soft tissue mobility   Straight leg raise; supine with opposite knee flexed, 2x8, bilat (patient significantly fatigued at 7 repetitions consistently) Bridge; x15 Lower trunk rotations, hooklying; 2x10 alternating Seated hip abduction with Tband, Green Tband; 2x10      Therapeutic activities - patient education, data gathering for patient's vital signs and S&S; functional activities to perform task performance during ADLs   -Measurement of BP and HR, monitoring S&S -Standing toe tap; 2x10 alternating, on 6-inch step, hands near // bars; standby assist -Standing heel raise; 2x10, light support on // bars         ASSESSMENT Patient arrives to clinic with excellent motivation to participate in therapy. Patient has moderate pain in low back and knees at arrival with low back pain self-limiting and diminished once patient is in lying position. She has fleeting pain with transition between full knee flexion and extension. Patient does exhibit improved flexion compared to IE. Patient is able to modestly progress exercise volume this AM and tolerates session well. She has normal resting HR and good blood pressure reading this AM. Pt will benefit from continued skilled PT intervention to address pain, mobility deficits, strength, and ability to perform transferring tasks as needed for best return of function and improved QoL.      PT Short Term Goals - 07/14/20 1823       PT SHORT TERM GOAL #1   Title Patient will be indepedent and 100% compliant with established HEP and activity modification as needed to augment PT intervention and improve strength as needed for accessing commuinty    Baseline HEP given at IE  07/14/2020    Time 3    Period Weeks    Status New    Target Date 08/03/20      PT SHORT TERM GOAL #2   Title Patient will have full knee ROM bilaterally without reproduction of pain as needed for managing lower extremity during bed mobility and self-care activities e.g. dressing, bathing    Baseline L knee flexion 110    Time 3    Period Weeks    Status New    Target Date 08/03/20               PT Long Term Goals - 07/13/20 1754       PT LONG TERM GOAL #1   Title Patient will demonstrate improved function as evidenced by a score of 54 on FOTO measure for full participation in activities at home and in the community.    Baseline FOTO 40 at IE 07/13/2020    Time  8    Period Weeks    Status New    Target Date 09/07/20      PT LONG TERM GOAL #2   Title Patient will perform sit to stand without UE support and no reproduction of pain as needed for transferring and home and community-level mobility    Baseline heavy UE support and difficulty with initiating lift-off of pelvis during sit to stand    Time 8    Period Weeks    Status New    Target Date 09/07/20      PT LONG TERM GOAL #3   Title Patient will have MMT4+/5 or greater for all tested LE musculature indicative of improved strength as needed for ability to perform transferring, stair/obstacle negotiation, and for ability to produce power sufficient for prevention of fall in event of large postural perturbation    Baseline MMT 4- hip flexion, quad, HS. MMT 4 for hip abduction, ankle inversion and eversion    Time 8    Period Weeks    Status New    Target Date 09/07/20      PT LONG TERM GOAL #4   Title Patient will independently perform stair ascent/descent with reciprocal stepping pattern and unilateral upper extremity support mimicking entering/existing home (3 steps to enter/exit) with no LOB/knee buckling    Baseline Difficulty with negotiating step-up onto carport and stairs to enter/exit home    Time 8    Period  Weeks    Status New    Target Date 09/07/20                  Plan - 07/21/20 0852     Clinical Impression Statement Patient arrives to clinic with excellent motivation to participate in therapy. Patient has moderate pain in low back and knees at arrival with low back pain self-limiting and diminished once patient is in lying position. She has fleeting pain with transition between full knee flexion and extension. Patient does exhibit improved flexion compared to IE. Patient is able to modestly progress exercise volume this AM and tolerates session well. She has normal resting HR and good blood pressure reading this AM. Pt will benefit from continued skilled PT intervention to address pain, mobility deficits, strength, and ability to perform transferring tasks as needed for best return of function and improved QoL.    Personal Factors and Comorbidities Age;Comorbidity 3+;Time since onset of injury/illness/exacerbation;Other   Patient lives alone   Examination-Activity Limitations Bed Mobility;Transfers;Sit;Locomotion Level;Stairs    Examination-Participation Restrictions Community Activity;Interpersonal Relationship;Shop    Stability/Clinical Decision Making Evolving/Moderate complexity    Rehab Potential Good    PT Frequency 2x / week    PT Duration 8 weeks    PT Treatment/Interventions Therapeutic activities;Therapeutic exercise;Neuromuscular re-education;Patient/family education;Manual techniques;Passive range of motion;Gait training    PT Next Visit Plan Continue with work on patellar/tibiofemoral mobility deficits, STM, knee ROM, LE strengthening, gentle L-spine mobility work. Progress with functional activities and CKC exercises as tolerated.    PT Home Exercise Plan HEP provided - Access Code [email or web address]    Consulted and Agree with Plan of Care Patient             Patient will benefit from skilled therapeutic intervention in order to improve the following deficits and  impairments:  Hypomobility, Decreased strength, Decreased range of motion, Difficulty walking, Pain  Visit Diagnosis: Chronic pain of left knee  Chronic pain of right knee  Muscle weakness (generalized)  Difficulty in walking, not elsewhere classified  Problem List There are no problems to display for this patient.  Consuela Mimes, PT, DPT [id number] Gertie Exon 07/21/2020, 9:21 AM  [facility] [address] [place], [place], [address] Phone: [phone]   Fax:  [phone]  Name: JAYLENN BAIZA MRN: [id number] Date of Birth: 1940-09-12

## 2020-07-26 ENCOUNTER — Encounter: Payer: Medicare Other | Admitting: Physical Therapy

## 2020-07-28 ENCOUNTER — Ambulatory Visit: Payer: Medicare Other | Admitting: Physical Therapy

## 2020-07-28 ENCOUNTER — Other Ambulatory Visit: Payer: Self-pay

## 2020-07-28 VITALS — BP 132/76 | HR 66

## 2020-07-28 DIAGNOSIS — R262 Difficulty in walking, not elsewhere classified: Secondary | ICD-10-CM

## 2020-07-28 DIAGNOSIS — M25562 Pain in left knee: Secondary | ICD-10-CM

## 2020-07-28 DIAGNOSIS — M25561 Pain in right knee: Secondary | ICD-10-CM

## 2020-07-28 DIAGNOSIS — M6281 Muscle weakness (generalized): Secondary | ICD-10-CM

## 2020-07-28 DIAGNOSIS — G8929 Other chronic pain: Secondary | ICD-10-CM

## 2020-07-28 NOTE — Patient Instructions (Signed)
    Objective Findings Knee PROM: R WNL, L WNL   Abrasion along proximal L shin: no sign of infection, mild ecchymosis inferior to abrasion.     Treatment Performed   Manual Therapy - for symptom modulation and soft tissue mobility/extensibility, joint accessory motion, patellar mobility, knee complex ROM   Patellar mobilization, gr III, all planes; bilaterally, emphasis on inferior glide STM vastus medialis and lateralis bilaterally Tibiofemoral posterior mobilization gr II-III for pain control and joint mobility Passive knee ROM within patient tolerance (L>RLE)     Therapeutic exercise - exercises to address ROM deficits, strengthening as needed for carryover to performance of transferring/stair negotiation/functional activities, soft tissue mobility    NuStep, Level 2 resistance; seat 5, arms 5 - for knee complex ROM and subjective information gathered during this time Straight leg raise; supine with opposite knee flexed, 2x8, bilat (patient significantly fatigued at 7 repetitions consistently) Bridge; x15 Lower trunk rotations, hooklying; 2x10 alternating Seated hip abduction with Tband, Green Tband; 2x10       Therapeutic activities - patient education, data gathering for patient's vital signs and S&S; functional activities to perform task performance during ADLs   -Measurement of BP and HR, monitoring S&S -Standing toe tap; 2x10 alternating, on 6-inch step, hands near // bars; standby assist -Standing heel raise; 2x10, light support on // bars         ASSESSMENT Patient arrives to clinic with excellent motivation to participate in therapy. Patient has moderate pain in low back and knees at arrival with low back pain self-limiting and diminished once patient is in lying position. She has fleeting pain with transition between full knee flexion and extension. Patient does exhibit improved flexion compared to IE. Patient is able to modestly progress exercise volume this AM and  tolerates session well. She has normal resting HR and good blood pressure reading this AM. Pt will benefit from continued skilled PT intervention to address pain, mobility deficits, strength, and ability to perform transferring tasks as needed for best return of function and improved QoL.

## 2020-07-28 NOTE — Therapy (Signed)
[facility] [address] [place], [place], [address] Phone: [phone]   Fax:  [phone]  Physical Therapy Treatment  Patient Details  Name: Natasha Walker MRN: [id number] Date of Birth: 1940-02-12 Referring Provider (PT): Erskine Speed, [place]   Encounter Date: 07/28/2020   PT End of Session - 07/28/20 0819     Visit Number 4    Number of Visits 17    Date for PT Re-Evaluation 09/07/20    Authorization Time Period IE 07/13/2020    Authorization - Visit Number 4    Authorization - Number of Visits 10    Progress Note Due on Visit 10    PT Start Time 0757    PT Stop Time 0840    PT Time Calculation (min) 43 min    Activity Tolerance Patient tolerated treatment well    Behavior During Therapy [facility] for tasks assessed/performed             Past Medical History:  Diagnosis Date   COPD (chronic obstructive pulmonary disease) (HCC)    Hypercholesteremia    Hypertension    TIA (transient ischemic attack)    several yrs ago. No deficits.   Vertigo    none recently   Wears dentures    partial upper    Past Surgical History:  Procedure Laterality Date   ABDOMINAL HYSTERECTOMY     BACK SURGERY     CATARACT EXTRACTION W/PHACO Left 10/20/2019   Procedure: CATARACT EXTRACTION PHACO AND INTRAOCULAR LENS PLACEMENT (IOC) LEFT ISTENT INJ 7.13  00:42.2;  Surgeon: Nevada Crane, MD;  [facility];  Service: Ophthalmology;  Laterality: Left;   CATARACT EXTRACTION W/PHACO Right 11/10/2019   Procedure: CATARACT EXTRACTION PHACO AND INTRAOCULAR LENS PLACEMENT (IOC) RIGHT;  Surgeon: Nevada Crane, MD;  [facility];  Service: Ophthalmology;  Laterality: Right;  5.52 0:44.0   TONSILLECTOMY     TUBAL LIGATION      Vitals:   07/28/20 0832  BP: 132/76  Pulse: 66     Subjective Assessment - 07/28/20 0803     Subjective Patient reports having some dizziness the last 2 days with history of  vertigo. She took Meclizine the previous evening and states it is better today. She reports no significant pain at arrival to PT. She reports Hx of PT to treat vertigo. She denies notable vertigo this AM. She reports low back pain when waking up this AM that improved with use of topical menthol.    Pertinent History Patient is a 80 year old female referred for bilateral knee osteoarthritis. Patient thought her main issue was her back. She has Hx of lumbar spine fusion about 5 years ago. Patient reports having difficulty with getting around, and she thought her back was the main issue at the time. Patient reports minor pain affecting her back and bilateral knees at this time. Patient reports some cramping in her calves; patient does not report numbness/paresthesias. Patient reports she has been dealing with pain for about 1 year. She reports working on exercise e.g. using bike to improve pain. No recent trauma. No surgical history in bilateral knees. Worse in the evenings.    Limitations Standing;Other (comment)   sit to stand   Diagnostic tests No recent imaging on hip or lumbar spine. Recent knee radiographs demonstrating degenerative change.    Patient Stated Goals Improved ability to "get around"    Currently in Pain? No/denies  Objective Findings Knee AROM: R WNL, L 116      Treatment Performed   Manual Therapy - for symptom modulation and soft tissue mobility/extensibility, joint accessory motion, patellar mobility, knee complex ROM   Patellar mobilization, gr III, all planes; bilaterally, emphasis on inferior glide STM vastus medialis and lateralis bilaterally Tibiofemoral posterior mobilization gr II-III for pain control and joint mobility Passive knee ROM within patient tolerance (L>RLE)     Therapeutic exercise - exercises to address ROM deficits, strengthening as needed for carryover to performance of transferring/stair negotiation/functional activities, soft tissue  mobility     NuStep, Level 2 resistance; seat 5, arms 5 - for knee complex ROM and subjective information gathered during this time Straight leg raise; supine with opposite knee flexed, 2x10, bilat  Bridge; 2x8 Lower trunk rotations, hooklying; 2x10 alternating  *not today* Seated hip abduction with Tband, Green Tband; 2x10       Therapeutic activities - patient education, data gathering for patient's vital signs and S&S; functional activities to perform task performance during ADLs   -Measurement of BP and HR, monitoring S&S -Standing toe tap; 2x10 alternating, on 6-inch step, hands near handrails bilat; standby assist -Standing heel raise; 2x10, handrails bilat -[employer] half squat, level 22; 20x    Patient education: discussed current progress; HEP update and review     ASSESSMENT Patient arrives to clinic with excellent motivation to participate in therapy. Patient has good vital signs with each visit in PT with no significant tachycardia. Discussed with patient continued monitoring and logging her HR and BP at home. Patient has less difficulty with straight leg raise and demonstrates improved stability with toe tap exercise with less reliance on UE support and improved motor control with return of foot to floor on each LE. She has WFL knee ROM. Patient is improving with intervention to date in regard to pain, ROM, and functional activity performance. Pt will benefit from continued skilled PT intervention to address pain, mobility deficits, strength, and ability to perform transferring tasks as needed for best return of function and improved QoL.     PT Short Term Goals - 07/14/20 1823       PT SHORT TERM GOAL #1   Title Patient will be indepedent and 100% compliant with established HEP and activity modification as needed to augment PT intervention and improve strength as needed for accessing commuinty    Baseline HEP given at IE 07/14/2020    Time 3    Period Weeks    Status  New    Target Date 08/03/20      PT SHORT TERM GOAL #2   Title Patient will have full knee ROM bilaterally without reproduction of pain as needed for managing lower extremity during bed mobility and self-care activities e.g. dressing, bathing    Baseline L knee flexion 110    Time 3    Period Weeks    Status New    Target Date 08/03/20               PT Long Term Goals - 07/13/20 1754       PT LONG TERM GOAL #1   Title Patient will demonstrate improved function as evidenced by a score of 54 on FOTO measure for full participation in activities at home and in the community.    Baseline FOTO 40 at IE 07/13/2020    Time 8    Period Weeks    Status New    Target Date 09/07/20  PT LONG TERM GOAL #2   Title Patient will perform sit to stand without UE support and no reproduction of pain as needed for transferring and home and community-level mobility    Baseline heavy UE support and difficulty with initiating lift-off of pelvis during sit to stand    Time 8    Period Weeks    Status New    Target Date 09/07/20      PT LONG TERM GOAL #3   Title Patient will have MMT4+/5 or greater for all tested LE musculature indicative of improved strength as needed for ability to perform transferring, stair/obstacle negotiation, and for ability to produce power sufficient for prevention of fall in event of large postural perturbation    Baseline MMT 4- hip flexion, quad, HS. MMT 4 for hip abduction, ankle inversion and eversion    Time 8    Period Weeks    Status New    Target Date 09/07/20      PT LONG TERM GOAL #4   Title Patient will independently perform stair ascent/descent with reciprocal stepping pattern and unilateral upper extremity support mimicking entering/existing home (3 steps to enter/exit) with no LOB/knee buckling    Baseline Difficulty with negotiating step-up onto carport and stairs to enter/exit home    Time 8    Period Weeks    Status New    Target Date 09/07/20                   Plan - 07/28/20 0855     Clinical Impression Statement Patient arrives to clinic with excellent motivation to participate in therapy. Patient has good vital signs with each visit in PT with no significant tachycardia. Discussed with patient continued monitoring and logging her HR and BP at home. Patient has less difficulty with straight leg raise and demonstrates improved stability with toe tap exercise with less reliance on UE support and improved motor control with return of foot to floor on each LE. She has WFL knee ROM. Patient is improving with intervention to date in regard to pain, ROM, and functional activity performance. Pt will benefit from continued skilled PT intervention to address pain, mobility deficits, strength, and ability to perform transferring tasks as needed for best return of function and improved QoL.    Personal Factors and Comorbidities Age;Comorbidity 3+;Time since onset of injury/illness/exacerbation;Other   Patient lives alone   Examination-Activity Limitations Bed Mobility;Transfers;Sit;Locomotion Level;Stairs    Examination-Participation Restrictions Community Activity;Interpersonal Relationship;Shop    Stability/Clinical Decision Making Evolving/Moderate complexity    Rehab Potential Good    PT Frequency 2x / week    PT Duration 8 weeks    PT Treatment/Interventions Therapeutic activities;Therapeutic exercise;Neuromuscular re-education;Patient/family education;Manual techniques;Passive range of motion;Gait training    PT Next Visit Plan Continue with work on patellar/tibiofemoral mobility deficits, STM, knee ROM, LE strengthening, gentle L-spine mobility work. Progress with functional activities and CKC exercises as tolerated.    PT Home Exercise Plan HEP provided - Access Code [email or web address]    Consulted and Agree with Plan of Care Patient             Patient will benefit from skilled therapeutic intervention in order to improve the  following deficits and impairments:  Hypomobility, Decreased strength, Decreased range of motion, Difficulty walking, Pain  Visit Diagnosis: Chronic pain of left knee  Chronic pain of right knee  Muscle weakness (generalized)  Difficulty in walking, not elsewhere classified     Problem List There are no problems  to display for this patient.  Consuela MimesJeremy Keairra Bardon, PT, DPT #[id number] Gertie ExonJeremy T Damesha Lawler 07/28/2020, 8:57 AM  [facility] [address] [place], [place], [address] Phone: [phone]   Fax:  [phone]  Name: Natasha Walker MRN: [id number] Date of Birth: [birth year]

## 2020-08-02 ENCOUNTER — Other Ambulatory Visit: Payer: Self-pay

## 2020-08-02 ENCOUNTER — Encounter: Payer: Self-pay | Admitting: Physical Therapy

## 2020-08-02 ENCOUNTER — Ambulatory Visit: Payer: Medicare Other | Admitting: Physical Therapy

## 2020-08-02 VITALS — BP 146/72 | HR 67

## 2020-08-02 DIAGNOSIS — R262 Difficulty in walking, not elsewhere classified: Secondary | ICD-10-CM

## 2020-08-02 DIAGNOSIS — M25561 Pain in right knee: Secondary | ICD-10-CM

## 2020-08-02 DIAGNOSIS — G8929 Other chronic pain: Secondary | ICD-10-CM

## 2020-08-02 DIAGNOSIS — M6281 Muscle weakness (generalized): Secondary | ICD-10-CM

## 2020-08-02 DIAGNOSIS — M25562 Pain in left knee: Secondary | ICD-10-CM | POA: Diagnosis not present

## 2020-08-02 NOTE — Therapy (Signed)
[facility] [address] [place], [place], [address] Phone: [phone]   Fax:  [phone]  Physical Therapy Treatment  Patient Details  Name: Natasha Walker MRN: [id number] Date of Birth: August 03, 1940 Referring Provider (PT): Erskine Speed, [place]   Encounter Date: 08/02/2020   PT End of Session - 08/02/20 0853     Visit Number 5    Number of Visits 17    Date for PT Re-Evaluation 09/07/20    Authorization Time Period IE 07/13/2020    Authorization - Visit Number 5    Authorization - Number of Visits 10    Progress Note Due on Visit 10    PT Start Time 0757    PT Stop Time 0844    PT Time Calculation (min) 47 min    Activity Tolerance Patient tolerated treatment well    Behavior During Therapy [facility] for tasks assessed/performed             Past Medical History:  Diagnosis Date   COPD (chronic obstructive pulmonary disease) (HCC)    Hypercholesteremia    Hypertension    TIA (transient ischemic attack)    several yrs ago. No deficits.   Vertigo    none recently   Wears dentures    partial upper    Past Surgical History:  Procedure Laterality Date   ABDOMINAL HYSTERECTOMY     BACK SURGERY     CATARACT EXTRACTION W/PHACO Left 10/20/2019   Procedure: CATARACT EXTRACTION PHACO AND INTRAOCULAR LENS PLACEMENT (IOC) LEFT ISTENT INJ 7.13  00:42.2;  Surgeon: Nevada Crane, MD;  Location: [facility];  Service: Ophthalmology;  Laterality: Left;   CATARACT EXTRACTION W/PHACO Right 11/10/2019   Procedure: CATARACT EXTRACTION PHACO AND INTRAOCULAR LENS PLACEMENT (IOC) RIGHT;  Surgeon: Nevada Crane, MD;  Location: [facility];  Service: Ophthalmology;  Laterality: Right;  5.52 0:44.0   TONSILLECTOMY     TUBAL LIGATION      Vitals:   08/02/20 0821  BP: (!) 146/72  Pulse: 67     Subjective Assessment - 08/02/20 0803     Subjective Patient reports no recent bouts of vertigo or dizziness. She  reports herr resting heart rate was in the mid 80's (bpm) the last few mornings. She states her BP has been in a good range. Patient reports no major issues over the weekend. Patient reports no major soreness after previous PT visit.    Pertinent History Patient is a 80 year old female referred for bilateral knee osteoarthritis. Patient thought her main issue was her back. She has Hx of lumbar spine fusion about 5 years ago. Patient reports having difficulty with getting around, and she thought her back was the main issue at the time. Patient reports minor pain affecting her back and bilateral knees at this time. Patient reports some cramping in her calves; patient does not report numbness/paresthesias. Patient reports she has been dealing with pain for about 1 year. She reports working on exercise e.g. using bike to improve pain. No recent trauma. No surgical history in bilateral knees. Worse in the evenings.    Limitations Standing;Other (comment)   sit to stand   Diagnostic tests No recent imaging on hip or lumbar spine. Recent knee radiographs demonstrating degenerative change.    Patient Stated Goals Improved ability to "get around"    Currently in Pain? No/denies             Objective Findings Knee AROM: R 118, L  118       Treatment Performed   Manual Therapy - for symptom modulation and soft tissue mobility/extensibility, joint accessory motion, patellar mobility, knee complex ROM   Patellar mobilization, gr III, all planes; bilaterally, emphasis on inferior glide STM vastus medialis and lateralis bilaterally Tibiofemoral posterior mobilization gr II-III for pain control and joint mobility Passive knee ROM within patient tolerance (L>RLE) with gentle overpressure at end-range flexion     Therapeutic exercise - exercises to address ROM deficits, strengthening as needed for carryover to performance of transferring/stair negotiation/functional activities, soft tissue mobility      NuStep, Level 2 resistance; seat 5, arms 5 - for knee complex ROM and subjective information gathered during this time  Bridge; 2x10 Lower trunk rotations, hooklying; 1x10 alternating Standing 3-way hip; x5 each direction; bilateral LE (in // bars)   *not today* Seated hip abduction with Tband, Green Tband; 2x10 Straight leg raise; supine with opposite knee flexed, 2x10, bilat        Therapeutic activities - patient education, data gathering for patient's vital signs and S&S; functional activities to perform task performance during ADLs   -Measurement of BP and HR, monitoring S&S -Standing toe tap; 2x10 alternating, on 6-inch step, hands near handrails bilat; standby assist -Standing heel raise; 2x10, handrails bilat -Total Gym half squat, level 22; 20x -Forward step-up; x10 each LE, 6-inch step    Patient education: discussed safe HR range and continued self-monitoring for vital signs at home     ASSESSMENT Patient arrives to clinic with excellent motivation to participate in therapy. Patient reports minimal pain today, though she does have low back pain in AM that is improved with topical treatment. Patient had mild knee flexion stiffness that was improved with mobilization and gentle manual stretching today. Patient has high systolic blood pressure this AM, but BP is not contraindicative of exercise. Discussed continued monitoring at home for vital signs. Patient has consistently had normal resting HR. Patient tolerates progression of closed-chain exercise well with increasing emphasis on functional positions and movements. Pt will benefit from continued skilled PT intervention to address pain, mobility deficits, strength, and ability to perform transferring tasks as needed for best return of function and improved QoL.       PT Short Term Goals - 07/14/20 1823       PT SHORT TERM GOAL #1   Title Patient will be indepedent and 100% compliant with established HEP and activity  modification as needed to augment PT intervention and improve strength as needed for accessing commuinty    Baseline HEP given at IE 07/14/2020    Time 3    Period Weeks    Status New    Target Date 08/03/20      PT SHORT TERM GOAL #2   Title Patient will have full knee ROM bilaterally without reproduction of pain as needed for managing lower extremity during bed mobility and self-care activities e.g. dressing, bathing    Baseline L knee flexion 110    Time 3    Period Weeks    Status New    Target Date 08/03/20               PT Long Term Goals - 07/13/20 1754       PT LONG TERM GOAL #1   Title Patient will demonstrate improved function as evidenced by a score of 54 on FOTO measure for full participation in activities at home and in the community.    Baseline FOTO 40  at IE 07/13/2020    Time 8    Period Weeks    Status New    Target Date 09/07/20      PT LONG TERM GOAL #2   Title Patient will perform sit to stand without UE support and no reproduction of pain as needed for transferring and home and community-level mobility    Baseline heavy UE support and difficulty with initiating lift-off of pelvis during sit to stand    Time 8    Period Weeks    Status New    Target Date 09/07/20      PT LONG TERM GOAL #3   Title Patient will have MMT4+/5 or greater for all tested LE musculature indicative of improved strength as needed for ability to perform transferring, stair/obstacle negotiation, and for ability to produce power sufficient for prevention of fall in event of large postural perturbation    Baseline MMT 4- hip flexion, quad, HS. MMT 4 for hip abduction, ankle inversion and eversion    Time 8    Period Weeks    Status New    Target Date 09/07/20      PT LONG TERM GOAL #4   Title Patient will independently perform stair ascent/descent with reciprocal stepping pattern and unilateral upper extremity support mimicking entering/existing home (3 steps to enter/exit) with  no LOB/knee buckling    Baseline Difficulty with negotiating step-up onto carport and stairs to enter/exit home    Time 8    Period Weeks    Status New    Target Date 09/07/20                   Plan - 08/02/20 0853     Clinical Impression Statement Patient arrives to clinic with excellent motivation to participate in therapy. Patient reports minimal pain today, though she does have low back pain in AM that is improved with topical treatment. Patient had mild knee flexion stiffness that was improved with mobilization and gentle manual stretching today. Patient has high systolic blood pressure this AM, but BP is not contraindicative of exercise. Discussed continued monitoring at home for vital signs. Patient has consistently had normal resting HR. Patient tolerates progression of closed-chain exercise well with increasing emphasis on functional positions and movements. Pt will benefit from continued skilled PT intervention to address pain, mobility deficits, strength, and ability to perform transferring tasks as needed for best return of function and improved QoL.    Personal Factors and Comorbidities Age;Comorbidity 3+;Time since onset of injury/illness/exacerbation;Other   Patient lives alone   Examination-Activity Limitations Bed Mobility;Transfers;Sit;Locomotion Level;Stairs    Examination-Participation Restrictions Community Activity;Interpersonal Relationship;Shop    Stability/Clinical Decision Making Evolving/Moderate complexity    Rehab Potential Good    PT Frequency 2x / week    PT Duration 8 weeks    PT Treatment/Interventions Therapeutic activities;Therapeutic exercise;Neuromuscular re-education;Patient/family education;Manual techniques;Passive range of motion;Gait training    PT Next Visit Plan Continue with work on patellar/tibiofemoral mobility deficits, STM, knee ROM, LE strengthening, gentle L-spine mobility work. Progress with functional activities and CKC exercises as  tolerated.    PT Home Exercise Plan HEP provided - Access Code [email or web address]    Consulted and Agree with Plan of Care Patient             Patient will benefit from skilled therapeutic intervention in order to improve the following deficits and impairments:  Hypomobility, Decreased strength, Decreased range of motion, Difficulty walking, Pain  Visit Diagnosis: Chronic pain of left  knee  Chronic pain of right knee  Muscle weakness (generalized)  Difficulty in walking, not elsewhere classified     Problem List There are no problems to display for this patient.  Consuela Mimes, PT, DPT #[id number] Gertie Exon 08/02/2020, 8:55 AM  [facility] [address] [place], [place], [address] Phone: [phone]   Fax:  [phone]  Name: Natasha Walker MRN: [id number] Date of Birth: November 27, 1940

## 2020-08-04 ENCOUNTER — Other Ambulatory Visit: Payer: Self-pay

## 2020-08-04 ENCOUNTER — Ambulatory Visit: Payer: Medicare Other | Admitting: Physical Therapy

## 2020-08-04 DIAGNOSIS — M6281 Muscle weakness (generalized): Secondary | ICD-10-CM

## 2020-08-04 DIAGNOSIS — R262 Difficulty in walking, not elsewhere classified: Secondary | ICD-10-CM

## 2020-08-04 DIAGNOSIS — M25562 Pain in left knee: Secondary | ICD-10-CM | POA: Diagnosis not present

## 2020-08-04 DIAGNOSIS — G8929 Other chronic pain: Secondary | ICD-10-CM

## 2020-08-04 NOTE — Therapy (Signed)
[facility] [address] [place], [place], [address] Phone: [phone]   Fax:  [phone]  Physical Therapy Treatment  Patient Details  Name: Natasha Walker MRN: [id number] Date of Birth: November 27, 1940 Referring Provider (PT): Melida Quitter, [place]   Encounter Date: 08/04/2020   PT End of Session - 08/04/20 0845     Visit Number 6    Number of Visits 17    Date for PT Re-Evaluation 09/07/20    Authorization Time Period IE 07/13/2020    Authorization - Visit Number 6    Authorization - Number of Visits 10    Progress Note Due on Visit 10    PT Start Time 0759    PT Stop Time 0843    PT Time Calculation (min) 44 min    Activity Tolerance Patient tolerated treatment well    Behavior During Therapy [facility] for tasks assessed/performed             Past Medical History:  Diagnosis Date   COPD (chronic obstructive pulmonary disease) ([place])    Hypercholesteremia    Hypertension    TIA (transient ischemic attack)    several yrs ago. No deficits.   Vertigo    none recently   Wears dentures    partial upper    Past Surgical History:  Procedure Laterality Date   ABDOMINAL HYSTERECTOMY     BACK SURGERY     CATARACT EXTRACTION W/PHACO Left 10/20/2019   Procedure: CATARACT EXTRACTION PHACO AND INTRAOCULAR LENS PLACEMENT (IOC) LEFT ISTENT INJ 7.13  00:42.2;  Surgeon: Eulogio Bear, MD;  Location: [place];  Service: Ophthalmology;  Laterality: Left;   CATARACT EXTRACTION W/PHACO Right 11/10/2019   Procedure: CATARACT EXTRACTION PHACO AND INTRAOCULAR LENS PLACEMENT (IOC) RIGHT;  Surgeon: Eulogio Bear, MD;  Location: [place];  Service: Ophthalmology;  Laterality: Right;  5.52 0:44.0   TONSILLECTOMY     TUBAL LIGATION      There were no vitals filed for this visit.   Subjective Assessment - 08/04/20 0805     Subjective Patient denies pain at arrival. She reports pain in her low back at 4/10 this AM  that impoved following use of Rx topical treatment (unspecified). She reports no pain at arrival to clinic. She reports feeling good after her last visit. She reports her vital signs have been WNL; HR 65 at rest yesterday.    Pertinent History Patient is a 80 year old female referred for bilateral knee osteoarthritis. Patient thought her main issue was her back. She has Hx of lumbar spine fusion about 5 years ago. Patient reports having difficulty with getting around, and she thought her back was the main issue at the time. Patient reports minor pain affecting her back and bilateral knees at this time. Patient reports some cramping in her calves; patient does not report numbness/paresthesias. Patient reports she has been dealing with pain for about 1 year. She reports working on exercise e.g. using bike to improve pain. No recent trauma. No surgical history in bilateral knees. Worse in the evenings.    Limitations Standing;Other (comment)   sit to stand   Diagnostic tests No recent imaging on hip or lumbar spine. Recent knee radiographs demonstrating degenerative change.    Patient Stated Goals Improved ability to "get around"    Currently in Pain? No/denies             Treatment Performed   Manual Therapy - for symptom modulation and  soft tissue mobility/extensibility, joint accessory motion, patellar mobility, knee complex ROM   Patellar mobilization, gr III, all planes; bilaterally, emphasis on inferior glide STM vastus medialis and lateralis bilaterally Tibiofemoral posterior mobilization gr II-III for pain control and joint mobility Passive knee ROM within patient tolerance (L>RLE) with gentle overpressure at end-range flexion     Therapeutic exercise - exercises to address ROM deficits, strengthening as needed for carryover to performance of transferring/stair negotiation/functional activities, soft tissue mobility     NuStep, Level 2 resistance; seat 5, arms 5 - unbilled time today    Bridge; 2x10 Lower trunk rotations, hooklying; 1x10 alternating In // bars: Standing 3-way hip; x5 each direction; bilateral LE, with 1.5 lb ankle weights on each leg    *not today* Seated hip abduction with Tband, Green Tband; 2x10 Straight leg raise; supine with opposite knee flexed, 2x10, bilat        Therapeutic activities - patient education, data gathering for patient's vital signs and S&S; functional activities to perform task performance during ADLs   -In // bars: Standing toe tap; 2x10 alternating, on [employer] with tap on 6-inch step, hands near handrails bilat; standby assist -Forward step-up; x15 each LE, 6-inch step -Sit to stand, raised-height table; 2x8 (21" table height)   Patient education: Discussed current progress, goals of PT, expected progression of activities in clinic   *next visit* -Total Gym half squat, level 22; 20x   *not today* -Standing heel raise; 2x10, handrails bilat   ASSESSMENT Patient arrives to clinic with excellent motivation to participate in therapy. Patient is continuing daily log for vital signs at this time and values are remaining WNL. Patient has good knee ROM at this time. She has remaining patellar mobility deficits and mild pain/stiffness with end-range flexion. Patient has low back pain in the AM that is improved with topical treatment. Patient is responding well with intervention to date and reports she is feeling better; pt is exhibiting improved performance of exercise, sit to stand, and obstacle negotiation compared to outset of PT. Pt will benefit from continued skilled PT intervention to address pain, mobility deficits, strength, and ability to perform transferring tasks as needed for best return of function and improved QoL.    PT Short Term Goals - 08/04/20 0858       PT SHORT TERM GOAL #1   Title Patient will be indepedent and 100% compliant with established HEP and activity modification as needed to augment PT intervention and  improve strength as needed for accessing commuinty    Baseline HEP given at IE 07/14/2020; 08/04/20: Patient is compliant with HEP    Time 3    Period Weeks    Status Achieved    Target Date 08/03/20      PT SHORT TERM GOAL #2   Title Patient will have full knee ROM bilaterally without reproduction of pain as needed for managing lower extremity during bed mobility and self-care activities e.g. dressing, bathing    Baseline IE: L knee flexion 110; 08/04/20: R and L knee flexion to 118 deg    Time 3    Period Weeks    Status Partially Met    Target Date 08/03/20               PT Long Term Goals - 07/13/20 1754       PT LONG TERM GOAL #1   Title Patient will demonstrate improved function as evidenced by a score of 54 on FOTO measure for full participation in  activities at home and in the community.    Baseline FOTO 40 at IE 07/13/2020    Time 8    Period Weeks    Status New    Target Date 09/07/20      PT LONG TERM GOAL #2   Title Patient will perform sit to stand without UE support and no reproduction of pain as needed for transferring and home and community-level mobility    Baseline heavy UE support and difficulty with initiating lift-off of pelvis during sit to stand    Time 8    Period Weeks    Status New    Target Date 09/07/20      PT LONG TERM GOAL #3   Title Patient will have MMT4+/5 or greater for all tested LE musculature indicative of improved strength as needed for ability to perform transferring, stair/obstacle negotiation, and for ability to produce power sufficient for prevention of fall in event of large postural perturbation    Baseline MMT 4- hip flexion, quad, HS. MMT 4 for hip abduction, ankle inversion and eversion    Time 8    Period Weeks    Status New    Target Date 09/07/20      PT LONG TERM GOAL #4   Title Patient will independently perform stair ascent/descent with reciprocal stepping pattern and unilateral upper extremity support mimicking  entering/existing home (3 steps to enter/exit) with no LOB/knee buckling    Baseline Difficulty with negotiating step-up onto carport and stairs to enter/exit home    Time 8    Period Weeks    Status New    Target Date 09/07/20                   Plan - 08/04/20 0857     Clinical Impression Statement Patient arrives to clinic with excellent motivation to participate in therapy. Patient is continuing daily log for vital signs at this time and values are remaining WNL. Patient has good knee ROM at this time. She has remaining patellar mobility deficits and mild pain/stiffness with end-range flexion. Patient has low back pain in the AM that is improved with topical treatment. Patient is responding well with intervention to date and reports she is feeling better; pt is exhibiting improved performance of exercise, sit to stand, and obstacle negotiation compared to outset of PT. Pt will benefit from continued skilled PT intervention to address pain, mobility deficits, strength, and ability to perform transferring tasks as needed for best return of function and improved QoL.    Personal Factors and Comorbidities Age;Comorbidity 3+;Time since onset of injury/illness/exacerbation;Other   Patient lives alone   Examination-Activity Limitations Bed Mobility;Transfers;Sit;Locomotion Level;Stairs    Examination-Participation Restrictions Community Activity;Interpersonal Relationship;Shop    Stability/Clinical Decision Making Evolving/Moderate complexity    Rehab Potential Good    PT Frequency 2x / week    PT Duration 8 weeks    PT Treatment/Interventions Therapeutic activities;Therapeutic exercise;Neuromuscular re-education;Patient/family education;Manual techniques;Passive range of motion;Gait training    PT Next Visit Plan Continue with work on patellar/tibiofemoral mobility deficits, STM, knee ROM, LE strengthening, gentle L-spine mobility work. Progress with increasing emphasis on functional  activities and CKC exercises as tolerated.    PT Home Exercise Plan HEP provided - Access Code [email or web address]    Consulted and Agree with Plan of Care Patient             Patient will benefit from skilled therapeutic intervention in order to improve the following deficits and impairments:  Hypomobility, Decreased strength, Decreased range of motion, Difficulty walking, Pain  Visit Diagnosis: Chronic pain of left knee  Chronic pain of right knee  Muscle weakness (generalized)  Difficulty in walking, not elsewhere classified     Problem List There are no problems to display for this patient.  Valentina Gu, PT, DPT #[id number] Eilleen Kempf 08/04/2020, 9:01 AM  [facility] [address] [place], [place], [address] Phone: [phone]   Fax:  [phone]  Name: KEIRAH KONITZER MRN: [id number] Date of Birth: 1940-10-18

## 2020-08-11 ENCOUNTER — Ambulatory Visit: Payer: Medicare Other | Attending: Physician Assistant | Admitting: Physical Therapy

## 2020-08-11 ENCOUNTER — Other Ambulatory Visit: Payer: Self-pay

## 2020-08-11 ENCOUNTER — Encounter: Payer: Self-pay | Admitting: Physical Therapy

## 2020-08-11 DIAGNOSIS — M25561 Pain in right knee: Secondary | ICD-10-CM | POA: Diagnosis present

## 2020-08-11 DIAGNOSIS — G8929 Other chronic pain: Secondary | ICD-10-CM

## 2020-08-11 DIAGNOSIS — M25562 Pain in left knee: Secondary | ICD-10-CM | POA: Diagnosis not present

## 2020-08-11 DIAGNOSIS — M6281 Muscle weakness (generalized): Secondary | ICD-10-CM | POA: Insufficient documentation

## 2020-08-11 DIAGNOSIS — R262 Difficulty in walking, not elsewhere classified: Secondary | ICD-10-CM | POA: Insufficient documentation

## 2020-08-11 NOTE — Therapy (Signed)
[facility] [address] [place], [place], [address] Phone: [phone]   Fax:  [phone]  Physical Therapy Treatment  Patient Details  Name: Natasha Walker MRN: [id number] Date of Birth: 10/02/1940 Referring Provider (PT): Melida Quitter, [place]   Encounter Date: 08/11/2020   PT End of Session - 08/11/20 3557     Visit Number 7    Number of Visits 17    Date for PT Re-Evaluation 09/07/20    Authorization Time Period IE 07/13/2020    Authorization - Visit Number 7    Authorization - Number of Visits 10    Progress Note Due on Visit 10    PT Start Time 0800    PT Stop Time 0844    PT Time Calculation (min) 44 min    Activity Tolerance Patient tolerated treatment well    Behavior During Therapy [facility] for tasks assessed/performed             Past Medical History:  Diagnosis Date   COPD (chronic obstructive pulmonary disease) ([place])    Hypercholesteremia    Hypertension    TIA (transient ischemic attack)    several yrs ago. No deficits.   Vertigo    none recently   Wears dentures    partial upper    Past Surgical History:  Procedure Laterality Date   ABDOMINAL HYSTERECTOMY     BACK SURGERY     CATARACT EXTRACTION W/PHACO Left 10/20/2019   Procedure: CATARACT EXTRACTION PHACO AND INTRAOCULAR LENS PLACEMENT (IOC) LEFT ISTENT INJ 7.13  00:42.2;  Surgeon: Eulogio Bear, MD;  Location: [place];  Service: Ophthalmology;  Laterality: Left;   CATARACT EXTRACTION W/PHACO Right 11/10/2019   Procedure: CATARACT EXTRACTION PHACO AND INTRAOCULAR LENS PLACEMENT (IOC) RIGHT;  Surgeon: Eulogio Bear, MD;  Location: [place];  Service: Ophthalmology;  Laterality: Right;  5.52 0:44.0   TONSILLECTOMY     TUBAL LIGATION      There were no vitals filed for this visit.   Subjective Assessment - 08/11/20 0806     Subjective Patient reports notable pain from last Wednesday until yesterday affecting R>L  knee. She states her symptoms have notably improved between yesterday and this AM. She denies notable pain at arrival to PT. Patient reports she is following up with opthalmologist - she has had significant erythema in her eyes and reports history of increase in opthalmic pressure. She states she wants to follow up with her PCP regarding color differences between L and R leg. She denies significant calf pain. She reports BP and HR remaining in good ranges.    Pertinent History Patient is a 80 year old female referred for bilateral knee osteoarthritis. Patient thought her main issue was her back. She has Hx of lumbar spine fusion about 5 years ago. Patient reports having difficulty with getting around, and she thought her back was the main issue at the time. Patient reports minor pain affecting her back and bilateral knees at this time. Patient reports some cramping in her calves; patient does not report numbness/paresthesias. Patient reports she has been dealing with pain for about 1 year. She reports working on exercise e.g. using bike to improve pain. No recent trauma. No surgical history in bilateral knees. Worse in the evenings.    Limitations Standing;Other (comment)   sit to stand   Diagnostic tests No recent imaging on hip or lumbar spine. Recent knee radiographs demonstrating degenerative change.    Patient Stated Goals  Improved ability to "get around"    Currently in Pain? No/denies             Vascular Screen Pulses:  Dorsalis pedis: R 2+, L 2+ Posterior tibial: R 2+, L 1+  Homan's sign: R Negative, L Negative Mild tenderness to palpation R mid-substance medial gastrocnemius; significant tenderness to palpation at tibialis anterior R>LLE No pitting edema No gross asymmetries in calf girth No ulcers or wounds along leg/foot No gross venous distention Unable to access capillary refill due to nail polish     TREATMENT    Manual Therapy - for symptom modulation and soft tissue  mobility/extensibility, joint accessory motion, patellar mobility, knee complex ROM   Patellar mobilization, gr III, all planes; bilaterally, emphasis on inferior glide STM/IASTM with Theraband roller: vastus medialis and lateralis R>L Tibiofemoral posterior mobilization gr II-III R>LLE for pain control and joint mobility Passive knee ROM within patient tolerance (L>RLE) with gentle overpressure at end-range flexion     Therapeutic exercise - exercises to address ROM deficits, strengthening as needed for carryover to performance of transferring/stair negotiation/functional activities, soft tissue mobility     NuStep, Level 2 resistance; seat 5, arms 5 - for knee complex ROM, and subjective information gathered during this time Lower trunk rotations, hooklying; 1x10 alternating In // bars: Standing 3-way hip; x5 each direction; bilateral LE, with 2 lb ankle weights on each leg     *not today* Bridge; 2x10 Seated hip abduction with Tband, Green Tband; 2x10 Straight leg raise; supine with opposite knee flexed, 2x10, bilat        Therapeutic activities - patient education, data gathering for patient's vital signs and S&S; functional activities to perform task performance during ADLs   -In // bars: Standing toe tap; 2x10 alternating, on Airex with tap on 6-inch step, hands near handrails bilat; standby assist -Sit to stand, raised-height table; 2x8 (22" table height)   -Brief vascular screen. Discussion with patient on signs of vascular compromise and S&S necessitating immediate medical attention. Discussed following up with PCP regarding differences in color between her legs.    [*next visit*] -Total Gym half squat, level 22; 20x    *not today* -Lateral step-up; x10 each LE, 6-inch step -Standing heel raise; 2x10, handrails bilat    ASSESSMENT Patient arrives to clinic with report of transient flare-up of R anterior knee pain that improved yesterday with minimal complaint of pain  this AM. Patient is continuing daily log for vital signs at this time and values are remaining WNL. She has history of venous insufficiency, but no major S&S of vascular disease this AM - only notable sensitivity along anterior compartment of each leg. Discussed signs warranting immediate medical attention; will continue to monitor for any conditions requiring referral to physician. Recommended follow-up with PCP regarding concerns with her legs. She tolerates low volume of CKC drills well today and reports feeling better at end of session today. She has remaining deficits in LE strength, patellar mobility and knee flexion ROM, and difficulties with transferring and stair/obstacle negotiation. Pt will benefit from continued skilled PT intervention to address pain, mobility deficits, strength, and ability to perform transferring tasks as needed for best return of function and improved QoL.    PT Short Term Goals - 08/04/20 0858       PT SHORT TERM GOAL #1   Title Patient will be indepedent and 100% compliant with established HEP and activity modification as needed to augment PT intervention and improve strength as needed for  accessing commuinty    Baseline HEP given at IE 07/14/2020; 08/04/20: Patient is compliant with HEP    Time 3    Period Weeks    Status Achieved    Target Date 08/03/20      PT SHORT TERM GOAL #2   Title Patient will have full knee ROM bilaterally without reproduction of pain as needed for managing lower extremity during bed mobility and self-care activities e.g. dressing, bathing    Baseline IE: L knee flexion 110; 08/04/20: R and L knee flexion to 118 deg    Time 3    Period Weeks    Status Partially Met    Target Date 08/03/20               PT Long Term Goals - 07/13/20 1754       PT LONG TERM GOAL #1   Title Patient will demonstrate improved function as evidenced by a score of 54 on FOTO measure for full participation in activities at home and in the community.     Baseline FOTO 40 at IE 07/13/2020    Time 8    Period Weeks    Status New    Target Date 09/07/20      PT LONG TERM GOAL #2   Title Patient will perform sit to stand without UE support and no reproduction of pain as needed for transferring and home and community-level mobility    Baseline heavy UE support and difficulty with initiating lift-off of pelvis during sit to stand    Time 8    Period Weeks    Status New    Target Date 09/07/20      PT LONG TERM GOAL #3   Title Patient will have MMT4+/5 or greater for all tested LE musculature indicative of improved strength as needed for ability to perform transferring, stair/obstacle negotiation, and for ability to produce power sufficient for prevention of fall in event of large postural perturbation    Baseline MMT 4- hip flexion, quad, HS. MMT 4 for hip abduction, ankle inversion and eversion    Time 8    Period Weeks    Status New    Target Date 09/07/20      PT LONG TERM GOAL #4   Title Patient will independently perform stair ascent/descent with reciprocal stepping pattern and unilateral upper extremity support mimicking entering/existing home (3 steps to enter/exit) with no LOB/knee buckling    Baseline Difficulty with negotiating step-up onto carport and stairs to enter/exit home    Time 8    Period Weeks    Status New    Target Date 09/07/20                   Plan - 08/11/20 0917     Clinical Impression Statement Patient arrives to clinic with report of transient flare-up of R anterior knee pain that improved yesterday with minimal complaint of pain this AM. Patient is continuing daily log for vital signs at this time and values are remaining WNL. She has history of venous insufficiency, but no major S&S of vascular disease this AM - only notable sensitivity along anterior compartment of each leg. Discussed signs warranting immediate medical attention; will continue to monitor for any conditions requiring referral to  physician. Recommended follow-up with PCP regarding concerns with her legs. She tolerates low volume of CKC drills well today and reports feeling better at end of session today. She has remaining deficits in LE strength, patellar  mobility and knee flexion ROM, and difficulties with transferring and stair/obstacle negotiation. Pt will benefit from continued skilled PT intervention to address pain, mobility deficits, strength, and ability to perform transferring tasks as needed for best return of function and improved QoL.    Personal Factors and Comorbidities Age;Comorbidity 3+;Time since onset of injury/illness/exacerbation;Other   Patient lives alone   Examination-Activity Limitations Bed Mobility;Transfers;Sit;Locomotion Level;Stairs    Examination-Participation Restrictions Community Activity;Interpersonal Relationship;Shop    Stability/Clinical Decision Making Evolving/Moderate complexity    Rehab Potential Good    PT Frequency 2x / week    PT Duration 8 weeks    PT Treatment/Interventions Therapeutic activities;Therapeutic exercise;Neuromuscular re-education;Patient/family education;Manual techniques;Passive range of motion;Gait training    PT Next Visit Plan Continue with work on patellar/tibiofemoral mobility deficits, STM, knee ROM, LE strengthening, gentle L-spine mobility work. Progress with increasing emphasis on functional activities and CKC exercises as tolerated.    PT Home Exercise Plan HEP provided - Access Code [email or web address]    Consulted and Agree with Plan of Care Patient             Patient will benefit from skilled therapeutic intervention in order to improve the following deficits and impairments:  Hypomobility, Decreased strength, Decreased range of motion, Difficulty walking, Pain  Visit Diagnosis: Chronic pain of left knee  Chronic pain of right knee  Muscle weakness (generalized)  Difficulty in walking, not elsewhere classified     Problem List There are no  problems to display for this patient.  Valentina Gu, PT, DPT #[id number] Natasha Walker 08/11/2020, 9:19 AM  [facility] [address] [place], [place], [address] Phone: [phone]   Fax:  [phone]  Name: Natasha Walker MRN: [id number] Date of Birth: April 29, 1940

## 2020-08-16 ENCOUNTER — Encounter: Payer: Medicare Other | Admitting: Physical Therapy

## 2020-08-18 ENCOUNTER — Encounter: Payer: Medicare Other | Admitting: Physical Therapy

## 2020-08-23 ENCOUNTER — Encounter: Payer: Medicare Other | Admitting: Physical Therapy

## 2020-08-25 ENCOUNTER — Encounter: Payer: Medicare Other | Admitting: Physical Therapy

## 2020-08-30 ENCOUNTER — Encounter: Payer: Medicare Other | Admitting: Physical Therapy

## 2020-09-01 ENCOUNTER — Encounter: Payer: Medicare Other | Admitting: Physical Therapy

## 2020-09-01 ENCOUNTER — Other Ambulatory Visit: Payer: Self-pay

## 2020-09-01 ENCOUNTER — Ambulatory Visit: Payer: Medicare Other | Admitting: Physical Therapy

## 2020-09-01 DIAGNOSIS — M25562 Pain in left knee: Secondary | ICD-10-CM

## 2020-09-01 DIAGNOSIS — M25561 Pain in right knee: Secondary | ICD-10-CM

## 2020-09-01 DIAGNOSIS — R262 Difficulty in walking, not elsewhere classified: Secondary | ICD-10-CM

## 2020-09-01 DIAGNOSIS — G8929 Other chronic pain: Secondary | ICD-10-CM

## 2020-09-01 DIAGNOSIS — M6281 Muscle weakness (generalized): Secondary | ICD-10-CM

## 2020-09-01 NOTE — Therapy (Signed)
[facility] [address]. [place], [place], [address] Phone: [phone]   Fax:  [phone]  Physical Therapy Treatment Physical Therapy Progress Note/Recertification   Dates of reporting period  07/13/20   to   09/01/20   Patient Details  Name: Natasha Walker MRN: [id number] Date of Birth: 1940-09-16 Referring Provider (PT): Melida Quitter, [place]   Encounter Date: 09/01/2020   PT End of Session - 09/01/20 0802     Visit Number 8    Number of Visits 17    Date for PT Re-Evaluation 09/07/20    Authorization Time Period IE 07/13/2020    Authorization - Visit Number 8    Authorization - Number of Visits 10    Progress Note Due on Visit 17    PT Start Time 0800    PT Stop Time 0842    PT Time Calculation (min) 42 min    Activity Tolerance Patient tolerated treatment well    Behavior During Therapy [facility] for tasks assessed/performed             Past Medical History:  Diagnosis Date   COPD (chronic obstructive pulmonary disease) ([place])    Hypercholesteremia    Hypertension    TIA (transient ischemic attack)    several yrs ago. No deficits.   Vertigo    none recently   Wears dentures    partial upper    Past Surgical History:  Procedure Laterality Date   ABDOMINAL HYSTERECTOMY     BACK SURGERY     CATARACT EXTRACTION W/PHACO Left 10/20/2019   Procedure: CATARACT EXTRACTION PHACO AND INTRAOCULAR LENS PLACEMENT (IOC) LEFT ISTENT INJ 7.13  00:42.2;  Surgeon: Eulogio Bear, MD;  Location: [place];  Service: Ophthalmology;  Laterality: Left;   CATARACT EXTRACTION W/PHACO Right 11/10/2019   Procedure: CATARACT EXTRACTION PHACO AND INTRAOCULAR LENS PLACEMENT (IOC) RIGHT;  Surgeon: Eulogio Bear, MD;  Location: [place];  Service: Ophthalmology;  Laterality: Right;  5.52 0:44.0   TONSILLECTOMY     TUBAL LIGATION      There were no vitals filed for this visit.   Subjective Assessment -  09/01/20 0807     Subjective Patient reports significant R knee pain since her last time in PT. She feels she is not able to move like she used to due to her R knee and her back. Patient reports she thought she was doing well in the beginning of physical therapy. She reports notable edema and swelling in her R knee that limited her earlier this month. Patient was seen in ED for significant R joint efffusion and R knee pain on 08/14/2020 with her previous therapy visit being on 7/06. Arthrocentesis and ultrasound was performed in ED with significant joint effusion found. Patient reports 4/10 pain affecting her R knee at this time. She reports no recent falls or near-falls. Patient reports very modest progress compared to when she first saw referring provider (limited due to setback/R knee flare-up in early July). Patient reports lying down is "not as bad." She reports difficulty with walkng around and standing for a prolonged period. Patient reports anterior infrapatellar knee pain. Patient reports that getting up to her carport and her front door steps is "not as good as it used to be." Earliest orthopedic appointment is in December - pt is working on following up earlier. She reports having a lot of pain in her back (Hx of lumbar fusion 5 years ago); she feels  that it hurts when she goes to stand.    Pertinent History Patient is a 80 year old female referred for bilateral knee osteoarthritis. Patient thought her main issue was her back. She has Hx of lumbar spine fusion about 5 years ago. Patient reports having difficulty with getting around, and she thought her back was the main issue at the time. Patient reports minor pain affecting her back and bilateral knees at this time. Patient reports some cramping in her calves; patient does not report numbness/paresthesias. Patient reports she has been dealing with pain for about 1 year. She reports working on exercise e.g. using bike to improve pain. No recent trauma.  No surgical history in bilateral knees. Worse in the evenings.    Limitations Standing;Other (comment)   sit to stand   Diagnostic tests No recent imaging on hip or lumbar spine. Recent knee radiographs demonstrating degenerative change.    Patient Stated Goals Improved ability to "get around"    Currently in Pain? Yes    Pain Score 4     Pain Location Knee    Pain Orientation Right              OBJECTIVE   MUSCULOSKELETAL: Tremor: Absent Bulk: Normal Tone: Normal, no spasticity, rigidity, or clonus Significant medial infrapatellar edema R knee.   Posture Forward head rounded shoulders posture   Lumbar/Hip AROM: Flexion moderately limited, minimal extension AROM; sidebending AROM WFL with Moderate contralateral flank pull/pain; thoracolumbar rotation AROM 75% each direction with moderate contralateral flank pull/pain;   Gait Decreased R weight shift during stance phase, mild decreased terminal knee extension at end of swing phase, forward flexed posture throughout gait cycle   Palpation Tenderness to palpation along medial joint line R knee > L knee Tenderness to palpation along bilateral T10-L2 thoracic and lumbar paraspinals   Strength R/L 4-/4 Hip flexion 4+/4+ Hip abduction (seated)  5/5 Hip adduction 4-*/4 Knee extension 4-/4 Knee flexion 4+/4+ Ankle Dorsiflexion 4+/5 Ankle Plantarflexion 4/4+ Ankle Inversion 4+/4+ Ankle Eversion *indicates pain   AROM   Knee R/L Flexion: 114*/114 Extension: 0/0 *indicates pain   Ankle R/L 50/50 Ankle Plantarflexion 12/15 Ankle Dorsiflexion *Indicates Pain   Passive Accessory Motion Superior Tibiofibular Joint: WNL Knee: Hypomobile posterior glide tibiofemoral joint  Patella: hypomobile in all directions  Ankle: WNL      FUNCTIONAL TESTS   Sit to stand: Min self-assist on anterior thighs for perform sit to stand without grimace or other pain behaviors, no buckling  Stair negotiation to be performed at  future visit      TREATMENT     Therapeutic exercise - exercises to address ROM deficits, strengthening as needed for carryover to performance of transferring/stair negotiation/functional activities, soft tissue mobility     NuStep, Level 2 resistance; seat 5, arms 5 - for knee complex ROM, and subjective information gathered during this time  Patient education: HEP update and review   Performed for HEP review: Short arc quad; x10 each LE SLR; x10    *not today* Lower trunk rotations, hooklying; 1x10 alternating In // bars: Standing 3-way hip; x5 each direction; bilateral LE, with 2 lb ankle weights on each leg  Bridge; 2x10 Seated hip abduction with Tband, Green Tband; 2x10 Straight leg raise; supine with opposite knee flexed, 2x10, bilat        Therapeutic activities - patient education, data gathering for patient's re-assessment; functional activities to improve performance of ADLs and transfers  -Reassessment performed (see above)        *  not today* -In // bars: Standing toe tap; 2x10 alternating, on Airex with tap on 6-inch step, hands near handrails bilat; standby assist -Sit to stand, raised-height table; 2x8 (22" table height)  -[employer] half squat, level 22; 20x -Lateral step-up; x10 each LE, 6-inch step -Standing heel raise; 2x10, handrails bilat      ASSESSMENT Patient has been out of clinic for the last 3 weeks due to episode of significant joint effusion, R knee buckling, and notable increase in pain the weekend of the 9th-10th. She was seen in ED and arthrocentesis was performed at the time. Patient held on PT for the following week and felt that she needed to hold on further visits following phone conversation with patient; pt wished to follow-up with orthopedist. Patient currently wishes to resume therapy with pt demonstrating modest improvement compared to early progress obtained in late June. She has symmetrical knee ROM (and WFL for flexion and  extension), though she still has mild end-range knee flexion stiffness with painful end-range on R side. She has significant medial joint line edema in R knee. She has modest improvement in manual muscle tests, improved performance of sit to stand, and improved quality of gait since outset of PT. Her FOTO score unfortunately reflects regression given recent status change in early July. She has remaining deficits in LE strength, patellar mobility, joint edema, mild knee flexion ROM deficit, R knee and thoracolumbar paraspinal pain, and difficulties with transferring and stair/obstacle negotiation. Pt will benefit from continued skilled PT intervention to address pain, mobility deficits, strength, and ability to perform transferring tasks as needed for best return of function and improved QoL.     PT Education - 09/01/20 1315     Education Details Patient educated on current progress in PT, continued plan for follow-up with orthopedist for R knee, prognosis, continued plan of care with modification of home program. Discussed edema management strategies for R knee.    Person(s) Educated Patient    Methods Explanation;Handout    Comprehension Verbalized understanding;Returned demonstration              PT Short Term Goals - 09/01/20 1312       PT SHORT TERM GOAL #1   Title Patient will be indepedent and 100% compliant with established HEP and activity modification as needed to augment PT intervention and improve strength as needed for accessing commuinty    Baseline HEP given at IE 07/14/2020; 08/04/20: Patient is compliant with HEP; 09/01/20: patient stopped HEP during significant joint effusion, she has returned to home exercise at this time    Time 3    Period Weeks    Status Achieved    Target Date 08/03/20      PT SHORT TERM GOAL #2   Title Patient will have full knee ROM bilaterally without reproduction of pain as needed for managing lower extremity during bed mobility and self-care  activities e.g. dressing, bathing    Baseline IE: L knee flexion 110; 08/04/20: R and L knee flexion to 118 deg. 09/01/20: Knee flexion to 114 bilaterally, pain with end-range R knee flexion    Time 3    Period Weeks    Status Partially Met    Target Date 09/22/20               PT Long Term Goals - 09/01/20 0807       PT LONG TERM GOAL #1   Title Patient will demonstrate improved function as evidenced by a score of  54 on FOTO measure for full participation in activities at home and in the community.    Baseline FOTO 40 at IE 07/13/2020; 09/01/20: 31    Time 8    Period Weeks    Status Not Met      PT LONG TERM GOAL #2   Title Patient will perform sit to stand without UE support and no reproduction of pain as needed for transferring and home and community-level mobility    Baseline IE: heavy UE support and difficulty with initiating lift-off of pelvis during sit to stand. 09/01/20: performed with minimal assist, performed without assist prior to R knee flare-up    Time 8    Period Weeks    Status Partially Met    Target Date 09/29/20      PT LONG TERM GOAL #3   Title Patient will have MMT4+/5 or greater for all tested LE musculature indicative of improved strength as needed for ability to perform transferring, stair/obstacle negotiation, and for ability to produce power sufficient for prevention of fall in event of large postural perturbation    Baseline IE: MMT 4- hip flexion, quad, HS. MMT 4 for hip abduction, ankle inversion and eversion.   09/01/20: remaining weakness in hip flexors, R quad, R HS, R ankle inversion.    Time 8    Period Weeks    Status Partially Met    Target Date 09/29/20      PT LONG TERM GOAL #4   Title Patient will independently perform stair ascent/descent with reciprocal stepping pattern and unilateral upper extremity support mimicking entering/existing home (3 steps to enter/exit) with no LOB/knee buckling    Baseline IE: Difficulty with negotiating  step-up onto carport and stairs to enter/exit home.   09/01/20: deferred.    Time 8    Period Weeks    Status Deferred    Target Date 09/29/20                   Plan - 09/02/20 0704     Clinical Impression Statement Patient has been out of clinic for the last 3 weeks due to episode of significant joint effusion, R knee buckling, and notable increase in pain the weekend of the 9th-10th. She was seen in ED and arthrocentesis was performed at the time. Patient held on PT for the following week and felt that she needed to hold on further visits following phone conversation with patient; pt wished to follow-up with orthopedist. Patient currently wishes to resume therapy with pt demonstrating modest improvement compared to early progress obtained in late June. She has symmetrical knee ROM (and WFL for flexion and extension), though she still has mild end-range knee flexion stiffness with painful end-range on R side. She has significant medial joint line edema in R knee. She has modest improvement in manual muscle tests, improved performance of sit to stand, and improved quality of gait since outset of PT. Her FOTO score unfortunately reflects regression given recent status change in early July. She has remaining deficits in LE strength, patellar mobility, joint edema, mild knee flexion ROM deficit, R knee and thoracolumbar paraspinal pain, and difficulties with transferring and stair/obstacle negotiation. Pt will benefit from continued skilled PT intervention to address pain, mobility deficits, strength, and ability to perform transferring tasks as needed for best return of function and improved QoL.    Personal Factors and Comorbidities Age;Comorbidity 3+;Time since onset of injury/illness/exacerbation;Other   Patient lives alone   Examination-Activity Limitations Bed Mobility;Transfers;Sit;Locomotion Level;Stairs  Examination-Participation Restrictions Community Activity;Interpersonal  Relationship;Shop    Stability/Clinical Decision Making Evolving/Moderate complexity    Rehab Potential Good    PT Frequency 2x / week    PT Duration 8 weeks    PT Treatment/Interventions Therapeutic activities;Therapeutic exercise;Neuromuscular re-education;Patient/family education;Manual techniques;Passive range of motion;Gait training    PT Next Visit Plan Continue with work on patellar/tibiofemoral mobility deficits, STM, knee ROM, LE strengthening, gentle L-spine mobility work. Maintaining low impact program until R knee pain is improved. Pt is awaiting f/u with orthopedist for R knee.    PT Home Exercise Plan HEP provided - Access Code [email or web address]    Consulted and Agree with Plan of Care Patient             Patient will benefit from skilled therapeutic intervention in order to improve the following deficits and impairments:  Hypomobility, Decreased strength, Decreased range of motion, Difficulty walking, Pain  Visit Diagnosis: Chronic pain of left knee  Chronic pain of right knee  Muscle weakness (generalized)  Difficulty in walking, not elsewhere classified     Problem List There are no problems to display for this patient.  Valentina Gu, PT, DPT #[id number] Eilleen Kempf 09/02/2020, 7:06 AM  [place] [facility] [address] [place], [place], [address] Phone: [phone]   Fax:  [phone]  Name: VIRGEN BELLAND MRN: [id number] Date of Birth: 1940/02/26

## 2020-09-02 ENCOUNTER — Encounter: Payer: Self-pay | Admitting: Physical Therapy

## 2020-09-06 ENCOUNTER — Ambulatory Visit: Payer: Medicare Other | Attending: Physician Assistant | Admitting: Physical Therapy

## 2020-09-06 ENCOUNTER — Other Ambulatory Visit: Payer: Self-pay

## 2020-09-06 ENCOUNTER — Encounter: Payer: Self-pay | Admitting: Physical Therapy

## 2020-09-06 DIAGNOSIS — M25562 Pain in left knee: Secondary | ICD-10-CM | POA: Diagnosis present

## 2020-09-06 DIAGNOSIS — M6281 Muscle weakness (generalized): Secondary | ICD-10-CM | POA: Diagnosis present

## 2020-09-06 DIAGNOSIS — G8929 Other chronic pain: Secondary | ICD-10-CM | POA: Diagnosis present

## 2020-09-06 DIAGNOSIS — M25561 Pain in right knee: Secondary | ICD-10-CM | POA: Insufficient documentation

## 2020-09-06 DIAGNOSIS — R262 Difficulty in walking, not elsewhere classified: Secondary | ICD-10-CM | POA: Diagnosis present

## 2020-09-06 NOTE — Therapy (Signed)
[facility] [address] [place], [place], [address] Phone: [phone]   Fax:  [phone]  Physical Therapy Treatment  Patient Details  Name: Natasha Walker MRN: [id number] Date of Birth: 08-Feb-1940 Referring Provider (PT): Melida Quitter, [place]   Encounter Date: 09/06/2020   PT End of Session - 09/06/20 0917     Visit Number 9    Number of Visits 17    Date for PT Re-Evaluation 09/07/20    Authorization Time Period IE 07/13/2020    Authorization - Visit Number --    Authorization - Number of Visits 10    Progress Note Due on Visit 17    PT Start Time 0847    PT Stop Time 0927    PT Time Calculation (min) 40 min    Activity Tolerance Patient tolerated treatment well    Behavior During Therapy [facility] for tasks assessed/performed             Past Medical History:  Diagnosis Date   COPD (chronic obstructive pulmonary disease) ([place])    Hypercholesteremia    Hypertension    TIA (transient ischemic attack)    several yrs ago. No deficits.   Vertigo    none recently   Wears dentures    partial upper    Past Surgical History:  Procedure Laterality Date   ABDOMINAL HYSTERECTOMY     BACK SURGERY     CATARACT EXTRACTION W/PHACO Left 10/20/2019   Procedure: CATARACT EXTRACTION PHACO AND INTRAOCULAR LENS PLACEMENT (IOC) LEFT ISTENT INJ 7.13  00:42.2;  Surgeon: Eulogio Bear, MD;  Location: [place];  Service: Ophthalmology;  Laterality: Left;   CATARACT EXTRACTION W/PHACO Right 11/10/2019   Procedure: CATARACT EXTRACTION PHACO AND INTRAOCULAR LENS PLACEMENT (IOC) RIGHT;  Surgeon: Eulogio Bear, MD;  Location: [place];  Service: Ophthalmology;  Laterality: Right;  5.52 0:44.0   TONSILLECTOMY     TUBAL LIGATION      There were no vitals filed for this visit.   Subjective Assessment - 09/06/20 0851     Subjective Patient reports 3/10  pain at arrival to PT. She reports pain mainly affecting R  anterior knee with pull in R hamstrings region. She reports doing okay with her last session. Patient is compliant with her HEP.    Pertinent History Patient is a 80 year old female referred for bilateral knee osteoarthritis. Patient thought her main issue was her back. She has Hx of lumbar spine fusion about 5 years ago. Patient reports having difficulty with getting around, and she thought her back was the main issue at the time. Patient reports minor pain affecting her back and bilateral knees at this time. Patient reports some cramping in her calves; patient does not report numbness/paresthesias. Patient reports she has been dealing with pain for about 1 year. She reports working on exercise e.g. using bike to improve pain. No recent trauma. No surgical history in bilateral knees. Worse in the evenings.    Limitations Standing;Other (comment)   sit to stand   Diagnostic tests No recent imaging on hip or lumbar spine. Recent knee radiographs demonstrating degenerative change.    Patient Stated Goals Improved ability to "get around"               TREATMENT     Therapeutic exercise - exercises to address ROM deficits, strengthening as needed for carryover to performance of transferring/stair negotiation/functional activities, soft tissue mobility     NuStep, Level  2 resistance; seat 5, arms 5 - for knee complex ROM, and subjective information gathered during this time Short arc quad; 2x10 each LE with 2-lb cuff weight SLR; 2x10 each side Heel slide with yellow physioball; x15 each LE      *not today* Lower trunk rotations, hooklying; 1x10 alternating In // bars: Standing 3-way hip; x5 each direction; bilateral LE, with 2 lb ankle weights on each leg  Bridge; 2x10 Seated hip abduction with Tband, Green Tband; 2x10 Straight leg raise; supine with opposite knee flexed, 2x10, bilat     Manual Therapy - for symptom modulation, soft tissue sensitivity and mobility to improve knee complex  ROM, patellofemoral joint mobility, ROM  (Emphasis on R knee>L knee today) Knee PROM within patient tolerance Patellar mobilization, grade III all planes STM IASTM with Theraband roller: rectus femoris and VMO, medial>lateral hamstrings      Therapeutic activities - patient education; functional activities to improve performance of ADLs and transfers   *next visit* In // bars: Standing toe tap; 2x10 alternating, on Airex with tap on 6-inch step, hands near handrails bilat; standby assist       *not today* -Sit to stand, raised-height table; 2x8 (22" table height)  -Total Gym half squat, level 22; 20x -Lateral step-up; x10 each LE, 6-inch step -Standing heel raise; 2x10, handrails bilat       ASSESSMENT Patient has less R knee pain compared to that reported last week. She has improved tolerance of end-range flexion of R knee and grossly WFL knee complex ROM bilaterally. Maintained low-impact program today and will gradually transition to light CKC drills with successive visits. She has remaining deficits in LE strength, patellar mobility, joint edema, mild knee flexion ROM deficit, R knee and thoracolumbar paraspinal pain, and difficulties with transferring and stair/obstacle negotiation. Pt will benefit from continued skilled PT intervention to address pain, mobility deficits, strength, and ability to perform transferring tasks as needed for best return of function and improved QoL.     PT Short Term Goals - 09/01/20 1312       PT SHORT TERM GOAL #1   Title Patient will be indepedent and 100% compliant with established HEP and activity modification as needed to augment PT intervention and improve strength as needed for accessing commuinty    Baseline HEP given at IE 07/14/2020; 08/04/20: Patient is compliant with HEP; 09/01/20: patient stopped HEP during significant joint effusion, she has returned to home exercise at this time    Time 3    Period Weeks    Status Achieved    Target  Date 08/03/20      PT SHORT TERM GOAL #2   Title Patient will have full knee ROM bilaterally without reproduction of pain as needed for managing lower extremity during bed mobility and self-care activities e.g. dressing, bathing    Baseline IE: L knee flexion 110; 08/04/20: R and L knee flexion to 118 deg. 09/01/20: Knee flexion to 114 bilaterally, pain with end-range R knee flexion    Time 3    Period Weeks    Status Partially Met    Target Date 09/22/20               PT Long Term Goals - 09/01/20 0807       PT LONG TERM GOAL #1   Title Patient will demonstrate improved function as evidenced by a score of 54 on FOTO measure for full participation in activities at home and in the community.  Baseline FOTO 40 at IE 07/13/2020; 09/01/20: 31    Time 8    Period Weeks    Status Not Met      PT LONG TERM GOAL #2   Title Patient will perform sit to stand without UE support and no reproduction of pain as needed for transferring and home and community-level mobility    Baseline IE: heavy UE support and difficulty with initiating lift-off of pelvis during sit to stand. 09/01/20: performed with minimal assist, performed without assist prior to R knee flare-up    Time 8    Period Weeks    Status Partially Met    Target Date 09/29/20      PT LONG TERM GOAL #3   Title Patient will have MMT4+/5 or greater for all tested LE musculature indicative of improved strength as needed for ability to perform transferring, stair/obstacle negotiation, and for ability to produce power sufficient for prevention of fall in event of large postural perturbation    Baseline IE: MMT 4- hip flexion, quad, HS. MMT 4 for hip abduction, ankle inversion and eversion.   09/01/20: remaining weakness in hip flexors, R quad, R HS, R ankle inversion.    Time 8    Period Weeks    Status Partially Met    Target Date 09/29/20      PT LONG TERM GOAL #4   Title Patient will independently perform stair ascent/descent with  reciprocal stepping pattern and unilateral upper extremity support mimicking entering/existing home (3 steps to enter/exit) with no LOB/knee buckling    Baseline IE: Difficulty with negotiating step-up onto carport and stairs to enter/exit home.   09/01/20: deferred.    Time 8    Period Weeks    Status Deferred    Target Date 09/29/20                   Plan - 09/06/20 1734     Clinical Impression Statement Patient has less R knee pain compared to that reported last week. She has improved tolerance of end-range flexion of R knee and grossly WFL knee complex ROM bilaterally. Maintained low-impact program today and will gradually transition to light CKC drills with successive visits. She has remaining deficits in LE strength, patellar mobility, joint edema, mild knee flexion ROM deficit, R knee and thoracolumbar paraspinal pain, and difficulties with transferring and stair/obstacle negotiation. Pt will benefit from continued skilled PT intervention to address pain, mobility deficits, strength, and ability to perform transferring tasks as needed for best return of function and improved QoL.    Personal Factors and Comorbidities Age;Comorbidity 3+;Time since onset of injury/illness/exacerbation;Other   Patient lives alone   Examination-Activity Limitations Bed Mobility;Transfers;Sit;Locomotion Level;Stairs    Examination-Participation Restrictions Community Activity;Interpersonal Relationship;Shop    Stability/Clinical Decision Making Evolving/Moderate complexity    Rehab Potential Good    PT Frequency 2x / week    PT Duration 8 weeks    PT Treatment/Interventions Therapeutic activities;Therapeutic exercise;Neuromuscular re-education;Patient/family education;Manual techniques;Passive range of motion;Gait training    PT Next Visit Plan Continue with work on patellar/tibiofemoral mobility deficits, STM, knee ROM, LE strengthening, gentle L-spine mobility work. Maintaining low impact program  until R knee pain is improved. Pt is awaiting f/u with orthopedist for R knee.    PT Home Exercise Plan HEP provided - Access Code [email or web address]    Consulted and Agree with Plan of Care Patient             Patient will benefit from skilled therapeutic  intervention in order to improve the following deficits and impairments:  Hypomobility, Decreased strength, Decreased range of motion, Difficulty walking, Pain  Visit Diagnosis: Chronic pain of left knee  Chronic pain of right knee  Muscle weakness (generalized)  Difficulty in walking, not elsewhere classified     Problem List There are no problems to display for this patient.  Valentina Gu, PT, DPT #[id number] Eilleen Kempf 09/06/2020, 5:35 PM  [facility] [address] [place], [place], [address] Phone: [phone]   Fax:  [phone]  Name: Natasha Walker MRN: [id number] Date of Birth: 09/18/1940

## 2020-09-08 ENCOUNTER — Ambulatory Visit: Payer: Medicare Other | Admitting: Physical Therapy

## 2020-09-08 ENCOUNTER — Other Ambulatory Visit: Payer: Self-pay

## 2020-09-08 ENCOUNTER — Encounter: Payer: Self-pay | Admitting: Physical Therapy

## 2020-09-08 DIAGNOSIS — M6281 Muscle weakness (generalized): Secondary | ICD-10-CM

## 2020-09-08 DIAGNOSIS — G8929 Other chronic pain: Secondary | ICD-10-CM

## 2020-09-08 DIAGNOSIS — R262 Difficulty in walking, not elsewhere classified: Secondary | ICD-10-CM

## 2020-09-08 DIAGNOSIS — M25562 Pain in left knee: Secondary | ICD-10-CM | POA: Diagnosis not present

## 2020-09-08 NOTE — Therapy (Signed)
[facility] [address] [place], [place], [address] Phone: [phone]   Fax:  [phone]  Physical Therapy Treatment  Patient Details  Name: Natasha Walker MRN: [id number] Date of Birth: 1940-10-01 Referring Provider (PT): Melida Quitter, [place]   Encounter Date: 09/08/2020   PT End of Session - 09/09/20 1039     Visit Number 10    Number of Visits 17    Date for PT Re-Evaluation 09/07/20    Authorization Time Period IE 07/13/2020, progress note 2/58, cert 5/27-7/82    Progress Note Due on Visit 57    PT Start Time 0846    PT Stop Time 0928    PT Time Calculation (min) 42 min    Activity Tolerance Patient tolerated treatment well    Behavior During Therapy [facility] for tasks assessed/performed             Past Medical History:  Diagnosis Date   COPD (chronic obstructive pulmonary disease) ([place])    Hypercholesteremia    Hypertension    TIA (transient ischemic attack)    several yrs ago. No deficits.   Vertigo    none recently   Wears dentures    partial upper    Past Surgical History:  Procedure Laterality Date   ABDOMINAL HYSTERECTOMY     BACK SURGERY     CATARACT EXTRACTION W/PHACO Left 10/20/2019   Procedure: CATARACT EXTRACTION PHACO AND INTRAOCULAR LENS PLACEMENT (IOC) LEFT ISTENT INJ 7.13  00:42.2;  Surgeon: Eulogio Bear, MD;  Location: [place];  Service: Ophthalmology;  Laterality: Left;   CATARACT EXTRACTION W/PHACO Right 11/10/2019   Procedure: CATARACT EXTRACTION PHACO AND INTRAOCULAR LENS PLACEMENT (IOC) RIGHT;  Surgeon: Eulogio Bear, MD;  Location: [place];  Service: Ophthalmology;  Laterality: Right;  5.52 0:44.0   TONSILLECTOMY     TUBAL LIGATION      There were no vitals filed for this visit.   Subjective Assessment - 09/08/20 0852     Subjective 2/10 pain at arrival. She reports moderate pain in R knee yesterday, but states it is not as bad this AM. She uses  Diclofenac topical ointment for her low back and feels this helps with pain in low back in the AM. Patient reports going okay after her last PT visit.    Pertinent History Patient is a 80 year old female referred for bilateral knee osteoarthritis. Patient thought her main issue was her back. She has Hx of lumbar spine fusion about 5 years ago. Patient reports having difficulty with getting around, and she thought her back was the main issue at the time. Patient reports minor pain affecting her back and bilateral knees at this time. Patient reports some cramping in her calves; patient does not report numbness/paresthesias. Patient reports she has been dealing with pain for about 1 year. She reports working on exercise e.g. using bike to improve pain. No recent trauma. No surgical history in bilateral knees. Worse in the evenings.    Limitations Standing;Other (comment)   sit to stand   Diagnostic tests No recent imaging on hip or lumbar spine. Recent knee radiographs demonstrating degenerative change.    Patient Stated Goals Improved ability to "get around"             TREATMENT     Therapeutic exercise - exercises to address ROM deficits, strengthening as needed for carryover to performance of transferring/stair negotiation/functional activities, soft tissue mobility     NuStep, Level  2 resistance; 5 minutes; seat 5, arms 5 - for knee complex ROM, and subjective information gathered during this time  Short arc quad; 2x10 each LE with 3.5-lb cuff weight (1.5 + 2 cuff weights)  SLR; 2x8 each side with 1.5-lb cuff weight     *not today* Heel slide with yellow physioball; x15 each LE  Lower trunk rotations, hooklying; 1x10 alternating In // bars: Standing 3-way hip; x5 each direction; bilateral LE, with 2 lb ankle weights on each leg  Bridge; 2x10 Seated hip abduction with Tband, Green Tband; 2x10 Straight leg raise; supine with opposite knee flexed, 2x10, bilat      Manual Therapy -  for symptom modulation, soft tissue sensitivity and mobility to improve knee complex ROM, patellofemoral joint mobility, ROM   (Emphasis on R knee>L knee today) Knee PROM within patient tolerance Patellar mobilization, grade III all planes STM IASTM with Theraband roller: rectus femoris and VMO, medial>lateral hamstrings Grade I-II posterior tibiofemoral mobilization for improved pain with flexion       Therapeutic activities - patient education; functional activities to improve performance of ADLs and transfers    In // bars:  Standing toe tap; 2x10 alternating, on Airex with tap on 6-inch step, hands near handrails bilat; standby assist Side step with Red Tband; x2 Down and back length of bars       *not today* -Sit to stand, raised-height table; 2x8 (22" table height)  -Total Gym half squat, level 22; 20x -Lateral step-up; x10 each LE, 6-inch step -Standing heel raise; 2x10, handrails bilat       ASSESSMENT Patient demonstrates [facility] knee ROM at this time, and she feels that pain with end-range flexion is less than it has been. She has relatively low level of pain compared to flare-up experienced in early July. Patient is able to modestly progress standing activity today (maintaining low-impact with activities performed) without notable pain. R knee edema has notably improved. She has remaining deficits in LE strength, patellar mobility, joint edema, mild knee flexion ROM deficit, R knee and thoracolumbar paraspinal pain, and difficulties with transferring and stair/obstacle negotiation. Pt will benefit from continued skilled PT intervention to address pain, mobility deficits, strength, and ability to perform transferring tasks as needed for best return of function and improved QoL.    PT Short Term Goals - 09/01/20 1312       PT SHORT TERM GOAL #1   Title Patient will be indepedent and 100% compliant with established HEP and activity modification as needed to augment PT  intervention and improve strength as needed for accessing commuinty    Baseline HEP given at IE 07/14/2020; 08/04/20: Patient is compliant with HEP; 09/01/20: patient stopped HEP during significant joint effusion, she has returned to home exercise at this time    Time 3    Period Weeks    Status Achieved    Target Date 08/03/20      PT SHORT TERM GOAL #2   Title Patient will have full knee ROM bilaterally without reproduction of pain as needed for managing lower extremity during bed mobility and self-care activities e.g. dressing, bathing    Baseline IE: L knee flexion 110; 08/04/20: R and L knee flexion to 118 deg. 09/01/20: Knee flexion to 114 bilaterally, pain with end-range R knee flexion    Time 3    Period Weeks    Status Partially Met    Target Date 09/22/20  PT Long Term Goals - 09/01/20 1962       PT LONG TERM GOAL #1   Title Patient will demonstrate improved function as evidenced by a score of 54 on FOTO measure for full participation in activities at home and in the community.    Baseline FOTO 40 at IE 07/13/2020; 09/01/20: 31    Time 8    Period Weeks    Status Not Met      PT LONG TERM GOAL #2   Title Patient will perform sit to stand without UE support and no reproduction of pain as needed for transferring and home and community-level mobility    Baseline IE: heavy UE support and difficulty with initiating lift-off of pelvis during sit to stand. 09/01/20: performed with minimal assist, performed without assist prior to R knee flare-up    Time 8    Period Weeks    Status Partially Met    Target Date 09/29/20      PT LONG TERM GOAL #3   Title Patient will have MMT4+/5 or greater for all tested LE musculature indicative of improved strength as needed for ability to perform transferring, stair/obstacle negotiation, and for ability to produce power sufficient for prevention of fall in event of large postural perturbation    Baseline IE: MMT 4- hip flexion,  quad, HS. MMT 4 for hip abduction, ankle inversion and eversion.   09/01/20: remaining weakness in hip flexors, R quad, R HS, R ankle inversion.    Time 8    Period Weeks    Status Partially Met    Target Date 09/29/20      PT LONG TERM GOAL #4   Title Patient will independently perform stair ascent/descent with reciprocal stepping pattern and unilateral upper extremity support mimicking entering/existing home (3 steps to enter/exit) with no LOB/knee buckling    Baseline IE: Difficulty with negotiating step-up onto carport and stairs to enter/exit home.   09/01/20: deferred.    Time 8    Period Weeks    Status Deferred    Target Date 09/29/20                   Plan - 09/09/20 1049     Clinical Impression Statement Patient demonstrates [facility] knee ROM at this time, and she feels that pain with end-range flexion is less than it has been. She has relatively low level of pain compared to flare-up experienced in early July. Patient is able to modestly progress standing activity today (maintaining low-impact with activities performed) without notable pain. R knee edema has notably improved. She has remaining deficits in LE strength, patellar mobility, joint edema, mild knee flexion ROM deficit, R knee and thoracolumbar paraspinal pain, and difficulties with transferring and stair/obstacle negotiation. Pt will benefit from continued skilled PT intervention to address pain, mobility deficits, strength, and ability to perform transferring tasks as needed for best return of function and improved QoL.    Personal Factors and Comorbidities Age;Comorbidity 3+;Time since onset of injury/illness/exacerbation;Other   Patient lives alone   Examination-Activity Limitations Bed Mobility;Transfers;Sit;Locomotion Level;Stairs    Examination-Participation Restrictions Community Activity;Interpersonal Relationship;Shop    Stability/Clinical Decision Making Evolving/Moderate complexity    Rehab Potential Good     PT Frequency 2x / week    PT Duration 8 weeks    PT Treatment/Interventions Therapeutic activities;Therapeutic exercise;Neuromuscular re-education;Patient/family education;Manual techniques;Passive range of motion;Gait training    PT Next Visit Plan Continue with work on patellar/tibiofemoral mobility deficits, STM, knee ROM, LE strengthening,  gentle L-spine mobility work. Maintaining low impact program until R knee pain is improved. Pt is awaiting f/u with orthopedist for R knee.    PT Home Exercise Plan HEP provided - Access Code [email or web address]    Consulted and Agree with Plan of Care Patient             Patient will benefit from skilled therapeutic intervention in order to improve the following deficits and impairments:  Hypomobility, Decreased strength, Decreased range of motion, Difficulty walking, Pain  Visit Diagnosis: Chronic pain of left knee  Chronic pain of right knee  Muscle weakness (generalized)  Difficulty in walking, not elsewhere classified     Problem List There are no problems to display for this patient.  Valentina Gu, PT, DPT #[id number]  Eilleen Kempf 09/09/2020, 10:50 AM  [place] [facility] [address] [place], [place], [address] Phone: [phone]   Fax:  [phone]  Name: Natasha Walker MRN: [id number] Date of Birth: 08-11-1940

## 2020-09-13 ENCOUNTER — Encounter: Payer: Self-pay | Admitting: Physical Therapy

## 2020-09-13 ENCOUNTER — Other Ambulatory Visit: Payer: Self-pay

## 2020-09-13 ENCOUNTER — Ambulatory Visit: Payer: Medicare Other | Admitting: Physical Therapy

## 2020-09-13 DIAGNOSIS — M6281 Muscle weakness (generalized): Secondary | ICD-10-CM

## 2020-09-13 DIAGNOSIS — G8929 Other chronic pain: Secondary | ICD-10-CM

## 2020-09-13 DIAGNOSIS — M25562 Pain in left knee: Secondary | ICD-10-CM | POA: Diagnosis not present

## 2020-09-13 DIAGNOSIS — R262 Difficulty in walking, not elsewhere classified: Secondary | ICD-10-CM

## 2020-09-13 NOTE — Therapy (Signed)
[facility] [address]. [place], [place], [address] Phone: [phone]   Fax:  [phone]  Physical Therapy Treatment  Patient Details  Name: Natasha Walker MRN: [id number] Date of Birth: 1940-09-20 Referring Provider (PT): Melida Quitter, [place]   Encounter Date: 09/13/2020   PT End of Session - 09/13/20 0786     Visit Number 11    Number of Visits 17    Date for PT Re-Evaluation 09/07/20    Authorization Time Period IE 07/13/2020, progress note 7/54, cert 4/92-0/10    Progress Note Due on Visit 17    PT Start Time 0930    PT Stop Time 1011    PT Time Calculation (min) 41 min    Activity Tolerance Patient tolerated treatment well    Behavior During Therapy [facility] for tasks assessed/performed             Past Medical History:  Diagnosis Date   COPD (chronic obstructive pulmonary disease) ([place])    Hypercholesteremia    Hypertension    TIA (transient ischemic attack)    several yrs ago. No deficits.   Vertigo    none recently   Wears dentures    partial upper    Past Surgical History:  Procedure Laterality Date   ABDOMINAL HYSTERECTOMY     BACK SURGERY     CATARACT EXTRACTION W/PHACO Left 10/20/2019   Procedure: CATARACT EXTRACTION PHACO AND INTRAOCULAR LENS PLACEMENT (IOC) LEFT ISTENT INJ 7.13  00:42.2;  Surgeon: Eulogio Bear, MD;  Location: [place];  Service: Ophthalmology;  Laterality: Left;   CATARACT EXTRACTION W/PHACO Right 11/10/2019   Procedure: CATARACT EXTRACTION PHACO AND INTRAOCULAR LENS PLACEMENT (IOC) RIGHT;  Surgeon: Eulogio Bear, MD;  Location: [place];  Service: Ophthalmology;  Laterality: Right;  5.52 0:44.0   TONSILLECTOMY     TUBAL LIGATION      There were no vitals filed for this visit.   Subjective Assessment - 09/13/20 0936     Subjective Patient reports 1/10 pain at arrival to PT. Patient follows up with orthopedics Friday 8/12. She reports minimal pain  at this time. She reports mild stiffness and feeling overall "different than she used to." She reports compliance with HEP - she reports using her bike also. Patient reports no major issues after last session.    Pertinent History Patient is a 80 year old female referred for bilateral knee osteoarthritis. Patient thought her main issue was her back. She has Hx of lumbar spine fusion about 5 years ago. Patient reports having difficulty with getting around, and she thought her back was the main issue at the time. Patient reports minor pain affecting her back and bilateral knees at this time. Patient reports some cramping in her calves; patient does not report numbness/paresthesias. Patient reports she has been dealing with pain for about 1 year. She reports working on exercise e.g. using bike to improve pain. No recent trauma. No surgical history in bilateral knees. Worse in the evenings.    Limitations Standing;Other (comment)   sit to stand   Diagnostic tests No recent imaging on hip or lumbar spine. Recent knee radiographs demonstrating degenerative change.    Patient Stated Goals Improved ability to "get around"              TREATMENT     Therapeutic exercise - exercises to address ROM deficits, strengthening as needed for carryover to performance of transferring/stair negotiation/functional activities, soft tissue mobility  NuStep, Level 3 resistance; 5 minutes; seat 5, arms 6 - for knee complex ROM, and subjective information gathered during this time   Short arc quad; 2x10 each LE with 4-lb cuff weight    SLR; 2x10 each side with 1.5-lb cuff weight     *not today* Heel slide with yellow physioball; x15 each LE  Lower trunk rotations, hooklying; 1x10 alternating In // bars: Standing 3-way hip; x5 each direction; bilateral LE, with 2 lb ankle weights on each leg  Bridge; 2x10 Seated hip abduction with Tband, Green Tband; 2x10 Straight leg raise; supine with opposite knee flexed,  2x10, bilat      Manual Therapy - for symptom modulation, soft tissue sensitivity and mobility to improve knee complex ROM, patellofemoral joint mobility, ROM   (Emphasis on R knee>L knee today) Knee PROM within patient tolerance Patellar mobilization, grade III all planes STM IASTM with Theraband roller: rectus femoris and VMO, medial>lateral hamstrings Grade I-II posterior tibiofemoral mobilization for improved pain with flexion       Therapeutic activities - patient education; functional activities to improve performance of ADLs and transfers     In // bars:  Standing toe tap; 2x10 alternating, on Airex with tap on 6-inch step, hands near handrails bilat; standby assist  *next visit* Side step with Red Tband; x2 Down and back length of bars Hurdle step-over, 6-inch hurdles       *not today* -Sit to stand, raised-height table; 2x8 (22" table height)  -Total Gym half squat, level 22; 20x -Lateral step-up; x10 each LE, 6-inch step -Standing heel raise; 2x10, handrails bilat       ASSESSMENT Patient has functional knee ROM bilaterally and has minimal pain this AM. Pt tolerates low volume of closed-chain activity well. She is making good progress following flare-up and significant R knee joint effusion in early July. Will continue to progress gentle weight-shifting activities and low-to-moderate volume of standing drills focusing on obstacle clearance and step negotiation with precaution for R knee joint effusion. R knee edema has notably improved. She has remaining deficits in LE strength, patellar mobility, joint edema, mild knee flexion ROM deficit, R knee and thoracolumbar paraspinal pain, and difficulties with transferring and stair/obstacle negotiation. Pt will benefit from continued skilled PT intervention to address pain, mobility deficits, strength, and ability to perform transferring tasks as needed for best return of function and improved QoL.     PT Short Term Goals -  09/01/20 1312       PT SHORT TERM GOAL #1   Title Patient will be indepedent and 100% compliant with established HEP and activity modification as needed to augment PT intervention and improve strength as needed for accessing commuinty    Baseline HEP given at IE 07/14/2020; 08/04/20: Patient is compliant with HEP; 09/01/20: patient stopped HEP during significant joint effusion, she has returned to home exercise at this time    Time 3    Period Weeks    Status Achieved    Target Date 08/03/20      PT SHORT TERM GOAL #2   Title Patient will have full knee ROM bilaterally without reproduction of pain as needed for managing lower extremity during bed mobility and self-care activities e.g. dressing, bathing    Baseline IE: L knee flexion 110; 08/04/20: R and L knee flexion to 118 deg. 09/01/20: Knee flexion to 114 bilaterally, pain with end-range R knee flexion    Time 3    Period Weeks    Status Partially  Met    Target Date 09/22/20               PT Long Term Goals - 09/01/20 0807       PT LONG TERM GOAL #1   Title Patient will demonstrate improved function as evidenced by a score of 54 on FOTO measure for full participation in activities at home and in the community.    Baseline FOTO 40 at IE 07/13/2020; 09/01/20: 31    Time 8    Period Weeks    Status Not Met      PT LONG TERM GOAL #2   Title Patient will perform sit to stand without UE support and no reproduction of pain as needed for transferring and home and community-level mobility    Baseline IE: heavy UE support and difficulty with initiating lift-off of pelvis during sit to stand. 09/01/20: performed with minimal assist, performed without assist prior to R knee flare-up    Time 8    Period Weeks    Status Partially Met    Target Date 09/29/20      PT LONG TERM GOAL #3   Title Patient will have MMT4+/5 or greater for all tested LE musculature indicative of improved strength as needed for ability to perform transferring,  stair/obstacle negotiation, and for ability to produce power sufficient for prevention of fall in event of large postural perturbation    Baseline IE: MMT 4- hip flexion, quad, HS. MMT 4 for hip abduction, ankle inversion and eversion.   09/01/20: remaining weakness in hip flexors, R quad, R HS, R ankle inversion.    Time 8    Period Weeks    Status Partially Met    Target Date 09/29/20      PT LONG TERM GOAL #4   Title Patient will independently perform stair ascent/descent with reciprocal stepping pattern and unilateral upper extremity support mimicking entering/existing home (3 steps to enter/exit) with no LOB/knee buckling    Baseline IE: Difficulty with negotiating step-up onto carport and stairs to enter/exit home.   09/01/20: deferred.    Time 8    Period Weeks    Status Deferred    Target Date 09/29/20                   Plan - 09/13/20 1030     Clinical Impression Statement Patient has functional knee ROM bilaterally and has minimal pain this AM. Pt tolerates low volume of closed-chain activity well. She is making good progress following flare-up and significant R knee joint effusion in early July. Will continue to progress gentle weight-shifting activities and low-to-moderate volume of standing drills focusing on obstacle clearance and step negotiation with precaution for R knee joint effusion. R knee edema has notably improved. She has remaining deficits in LE strength, patellar mobility, joint edema, mild knee flexion ROM deficit, R knee and thoracolumbar paraspinal pain, and difficulties with transferring and stair/obstacle negotiation. Pt will benefit from continued skilled PT intervention to address pain, mobility deficits, strength, and ability to perform transferring tasks as needed for best return of function and improved QoL.    Personal Factors and Comorbidities Age;Comorbidity 3+;Time since onset of injury/illness/exacerbation;Other   Patient lives alone    Examination-Activity Limitations Bed Mobility;Transfers;Sit;Locomotion Level;Stairs    Examination-Participation Restrictions Community Activity;Interpersonal Relationship;Shop    Stability/Clinical Decision Making Evolving/Moderate complexity    Rehab Potential Good    PT Frequency 2x / week    PT Duration 8 weeks  PT Treatment/Interventions Therapeutic activities;Therapeutic exercise;Neuromuscular re-education;Patient/family education;Manual techniques;Passive range of motion;Gait training    PT Next Visit Plan Continue with work on patellar/tibiofemoral mobility deficits, STM, knee ROM, LE strengthening, gentle L-spine mobility work. Maintaining low impact program until R knee pain is improved. Pt is awaiting f/u with orthopedist for R knee.    PT Home Exercise Plan HEP provided - Access Code [email or web address]    Consulted and Agree with Plan of Care Patient             Patient will benefit from skilled therapeutic intervention in order to improve the following deficits and impairments:  Hypomobility, Decreased strength, Decreased range of motion, Difficulty walking, Pain  Visit Diagnosis: Chronic pain of left knee  Chronic pain of right knee  Muscle weakness (generalized)  Difficulty in walking, not elsewhere classified     Problem List There are no problems to display for this patient.  Valentina Gu, PT, DPT #[id number]  Eilleen Kempf 09/13/2020, 10:30 AM  [place] [facility] [address] [place], [place], [address] Phone: [phone]   Fax:  [phone]  Name: Natasha Walker MRN: [id number] Date of Birth: 01-24-1941

## 2020-09-15 ENCOUNTER — Ambulatory Visit: Payer: Medicare Other | Admitting: Physical Therapy

## 2020-09-15 ENCOUNTER — Other Ambulatory Visit: Payer: Self-pay

## 2020-09-15 DIAGNOSIS — M6281 Muscle weakness (generalized): Secondary | ICD-10-CM

## 2020-09-15 DIAGNOSIS — R262 Difficulty in walking, not elsewhere classified: Secondary | ICD-10-CM

## 2020-09-15 DIAGNOSIS — G8929 Other chronic pain: Secondary | ICD-10-CM

## 2020-09-15 DIAGNOSIS — M25562 Pain in left knee: Secondary | ICD-10-CM | POA: Diagnosis not present

## 2020-09-15 NOTE — Therapy (Signed)
[facility] [address]. [place], [place], [address] Phone: [phone]   Fax:  [phone]  Physical Therapy Treatment  Patient Details  Name: Natasha Walker MRN: [id number] Date of Birth: 09-Mar-1940 Referring Provider (PT): Melida Quitter, [place]   Encounter Date: 09/15/2020   PT End of Session - 09/15/20 0854     Visit Number 12    Number of Visits 17    Date for PT Re-Evaluation 09/07/20    Authorization Time Period IE 07/13/2020, progress note 0/86, cert 7/61-9/50    Progress Note Due on Visit 97    PT Start Time 0849    PT Stop Time 0936    PT Time Calculation (min) 47 min    Activity Tolerance Patient tolerated treatment well    Behavior During Therapy [facility] for tasks assessed/performed             Past Medical History:  Diagnosis Date   COPD (chronic obstructive pulmonary disease) ([place])    Hypercholesteremia    Hypertension    TIA (transient ischemic attack)    several yrs ago. No deficits.   Vertigo    none recently   Wears dentures    partial upper    Past Surgical History:  Procedure Laterality Date   ABDOMINAL HYSTERECTOMY     BACK SURGERY     CATARACT EXTRACTION W/PHACO Left 10/20/2019   Procedure: CATARACT EXTRACTION PHACO AND INTRAOCULAR LENS PLACEMENT (IOC) LEFT ISTENT INJ 7.13  00:42.2;  Surgeon: Eulogio Bear, MD;  Location: [place];  Service: Ophthalmology;  Laterality: Left;   CATARACT EXTRACTION W/PHACO Right 11/10/2019   Procedure: CATARACT EXTRACTION PHACO AND INTRAOCULAR LENS PLACEMENT (IOC) RIGHT;  Surgeon: Eulogio Bear, MD;  Location: [employer];  Service: Ophthalmology;  Laterality: Right;  5.52 0:44.0   TONSILLECTOMY     TUBAL LIGATION      There were no vitals filed for this visit.   Subjective Assessment - 09/15/20 0855     Subjective Patient reports 1/10 pain affecting R knee at arrival to PT. She reports feeling much better at this time. She reports  5/10 pain affecting her low back at arrival to PT and some stressors recently related to her daughter traveling from [place] to [place] for interview in [place]. Patient reports Voltaren gel usually helps with low back pain.    Pertinent History Patient is a 80 year old female referred for bilateral knee osteoarthritis. Patient thought her main issue was her back. She has Hx of lumbar spine fusion about 5 years ago. Patient reports having difficulty with getting around, and she thought her back was the main issue at the time. Patient reports minor pain affecting her back and bilateral knees at this time. Patient reports some cramping in her calves; patient does not report numbness/paresthesias. Patient reports she has been dealing with pain for about 1 year. She reports working on exercise e.g. using bike to improve pain. No recent trauma. No surgical history in bilateral knees. Worse in the evenings.    Limitations Standing;Other (comment)   sit to stand   Diagnostic tests No recent imaging on hip or lumbar spine. Recent knee radiographs demonstrating degenerative change.    Patient Stated Goals Improved ability to "get around"                  TREATMENT     Therapeutic exercise - exercises to address ROM deficits, strengthening as needed for carryover to performance of  transferring/stair negotiation/functional activities, soft tissue mobility     NuStep, Level 3 resistance; 5 minutes; seat 5, arms 6 - for knee complex ROM, and subjective information gathered during this time  SLR; 2x10 each side with 2-lb cuff weight  Bridge; x15  Swiss ball lumbar spine rollout, large blue physioball; x5 each direction, 3 sec hold     *not today* Short arc quad; 2x10 each LE with 4-lb cuff weight  Heel slide with yellow physioball; x15 each LE  Lower trunk rotations, hooklying; 1x10 alternating In // bars: Standing 3-way hip; x5 each direction; bilateral LE, with 2 lb ankle weights on each leg   Seated  hip abduction with Tband, Green Tband; 2x10 Straight leg raise; supine with opposite knee flexed, 2x10, bilat      Manual Therapy - for symptom modulation, soft tissue sensitivity and mobility to improve knee complex ROM, patellofemoral joint mobility, ROM  STM and IASTM with Hypervolt along bilateral lumbar paraspinals L1-L4   (Emphasis on R knee>L knee today) Knee PROM within patient tolerance Patellar mobilization, grade III all planes Grade I-II posterior tibiofemoral mobilization for improved pain with flexion    *not today* STM IASTM with Theraband roller: rectus femoris and VMO, medial>lateral hamstrings     Therapeutic activities - patient education; functional activities to improve performance of ADLs and transfers     In // bars:  Standing toe tap; 2x10 alternating, on Airex with tap on 6-inch step, hands near handrails bilat; standby assist Hurdle step-over, 3 6-inch hurdles; x5 DB   *next visit* Side step with Red Tband; x2 Down and back length of bars        *not today* -Sit to stand, raised-height table; 2x8 (22" table height)  -Total Gym half squat, level 22; 20x -Lateral step-up; x10 each LE, 6-inch step -Standing heel raise; 2x10, handrails bilat       ASSESSMENT Patient exhibits good bilateral knee ROM at this time and has very mild pain along R knee joint line, no L knee pain at this time. She has more significant complaint of low back pain this AM for which patient has been using Voltaren with good response. Patient has taut and tender L1-L4 longissimus lumborum and responds well to IASTM utilized today. Continued gradual progression of weightbearing activity with low volume of CKC exercise/weightbearing performed. She exhibits fair negotiation of hurdles with pt able to negotiate all 3 hurdles x 2 without UE support. She has remaining deficits in LE strength, patellar mobility, joint edema, R knee and thoracolumbar paraspinal pain, and difficulties with  transferring and stair/obstacle negotiation. Pt will benefit from continued skilled PT intervention to address pain, mobility deficits, strength, and ability to perform transferring tasks as needed for best return of function and improved QoL.     PT Short Term Goals - 09/01/20 1312       PT SHORT TERM GOAL #1   Title Patient will be indepedent and 100% compliant with established HEP and activity modification as needed to augment PT intervention and improve strength as needed for accessing commuinty    Baseline HEP given at IE 07/14/2020; 08/04/20: Patient is compliant with HEP; 09/01/20: patient stopped HEP during significant joint effusion, she has returned to home exercise at this time    Time 3    Period Weeks    Status Achieved    Target Date 08/03/20      PT SHORT TERM GOAL #2   Title Patient will have full knee ROM bilaterally without  reproduction of pain as needed for managing lower extremity during bed mobility and self-care activities e.g. dressing, bathing    Baseline IE: L knee flexion 110; 08/04/20: R and L knee flexion to 118 deg. 09/01/20: Knee flexion to 114 bilaterally, pain with end-range R knee flexion    Time 3    Period Weeks    Status Partially Met    Target Date 09/22/20               PT Long Term Goals - 09/01/20 0807       PT LONG TERM GOAL #1   Title Patient will demonstrate improved function as evidenced by a score of 54 on FOTO measure for full participation in activities at home and in the community.    Baseline FOTO 40 at IE 07/13/2020; 09/01/20: 31    Time 8    Period Weeks    Status Not Met      PT LONG TERM GOAL #2   Title Patient will perform sit to stand without UE support and no reproduction of pain as needed for transferring and home and community-level mobility    Baseline IE: heavy UE support and difficulty with initiating lift-off of pelvis during sit to stand. 09/01/20: performed with minimal assist, performed without assist prior to R knee  flare-up    Time 8    Period Weeks    Status Partially Met    Target Date 09/29/20      PT LONG TERM GOAL #3   Title Patient will have MMT4+/5 or greater for all tested LE musculature indicative of improved strength as needed for ability to perform transferring, stair/obstacle negotiation, and for ability to produce power sufficient for prevention of fall in event of large postural perturbation    Baseline IE: MMT 4- hip flexion, quad, HS. MMT 4 for hip abduction, ankle inversion and eversion.   09/01/20: remaining weakness in hip flexors, R quad, R HS, R ankle inversion.    Time 8    Period Weeks    Status Partially Met    Target Date 09/29/20      PT LONG TERM GOAL #4   Title Patient will independently perform stair ascent/descent with reciprocal stepping pattern and unilateral upper extremity support mimicking entering/existing home (3 steps to enter/exit) with no LOB/knee buckling    Baseline IE: Difficulty with negotiating step-up onto carport and stairs to enter/exit home.   09/01/20: deferred.    Time 8    Period Weeks    Status Deferred    Target Date 09/29/20                   Plan - 09/15/20 1006     Clinical Impression Statement Patient exhibits good bilateral knee ROM at this time and has very mild pain along R knee joint line, no L knee pain at this time. She has more significant complaint of low back pain this AM for which patient has been using Voltaren with good response. Patient has taut and tender L1-L4 longissimus lumborum and responds well to IASTM utilized today. Continued gradual progression of weightbearing activity with low volume of CKC exercise/weightbearing performed. She exhibits fair negotiation of hurdles with pt able to negotiate all 3 hurdles x 2 without UE support. She has remaining deficits in LE strength, patellar mobility, joint edema, R knee and thoracolumbar paraspinal pain, and difficulties with transferring and stair/obstacle negotiation. Pt  will benefit from continued skilled PT intervention to address pain, mobility  deficits, strength, and ability to perform transferring tasks as needed for best return of function and improved QoL.    Personal Factors and Comorbidities Age;Comorbidity 3+;Time since onset of injury/illness/exacerbation;Other   Patient lives alone   Examination-Activity Limitations Bed Mobility;Transfers;Sit;Locomotion Level;Stairs    Examination-Participation Restrictions Community Activity;Interpersonal Relationship;Shop    Stability/Clinical Decision Making Evolving/Moderate complexity    Rehab Potential Good    PT Frequency 2x / week    PT Duration 8 weeks    PT Treatment/Interventions Therapeutic activities;Therapeutic exercise;Neuromuscular re-education;Patient/family education;Manual techniques;Passive range of motion;Gait training    PT Next Visit Plan Continue with work on patellar/tibiofemoral mobility deficits, STM, knee ROM, LE strengthening, gentle L-spine mobility work. Maintaining low impact program until R knee pain is improved. Pt is awaiting f/u with orthopedist for R knee.    PT Home Exercise Plan HEP provided - Access Code [email or web address]    Consulted and Agree with Plan of Care Patient             Patient will benefit from skilled therapeutic intervention in order to improve the following deficits and impairments:  Hypomobility, Decreased strength, Decreased range of motion, Difficulty walking, Pain  Visit Diagnosis: Chronic pain of left knee  Chronic pain of right knee  Muscle weakness (generalized)  Difficulty in walking, not elsewhere classified     Problem List There are no problems to display for this patient.  Valentina Gu, PT, DPT #[id number]   Eilleen Kempf 09/15/2020, 10:13 AM  [place] [facility] [address] [place], [place], [address] Phone: [phone]   Fax:  [phone]  Name: Natasha Walker MRN: [id number] Date of  Birth: Oct 10, 1940

## 2020-09-20 ENCOUNTER — Ambulatory Visit: Payer: Medicare Other | Admitting: Physical Therapy

## 2020-09-20 ENCOUNTER — Encounter: Payer: Self-pay | Admitting: Physical Therapy

## 2020-09-20 ENCOUNTER — Other Ambulatory Visit: Payer: Self-pay

## 2020-09-20 DIAGNOSIS — G8929 Other chronic pain: Secondary | ICD-10-CM

## 2020-09-20 DIAGNOSIS — M25562 Pain in left knee: Secondary | ICD-10-CM | POA: Diagnosis not present

## 2020-09-20 DIAGNOSIS — M6281 Muscle weakness (generalized): Secondary | ICD-10-CM

## 2020-09-20 DIAGNOSIS — R262 Difficulty in walking, not elsewhere classified: Secondary | ICD-10-CM

## 2020-09-20 NOTE — Therapy (Signed)
[facility] [address] [place], [place], [address] Phone: [phone]   Fax:  [phone]  Physical Therapy Treatment  Patient Details  Name: Natasha Walker MRN: [id number] Date of Birth: 1940/04/27 Referring Provider (PT): Melida Quitter, [place]   Encounter Date: 09/20/2020   PT End of Session - 09/20/20 1022     Visit Number 13    Number of Visits 17    Date for PT Re-Evaluation 09/07/20    Authorization Time Period IE 07/13/2020, progress note 8/33, cert 8/25-0/53    Progress Note Due on Visit 17    PT Start Time 1018    PT Stop Time 1059    PT Time Calculation (min) 41 min    Activity Tolerance Patient tolerated treatment well    Behavior During Therapy [facility] for tasks assessed/performed             Past Medical History:  Diagnosis Date   COPD (chronic obstructive pulmonary disease) ([place])    Hypercholesteremia    Hypertension    TIA (transient ischemic attack)    several yrs ago. No deficits.   Vertigo    none recently   Wears dentures    partial upper    Past Surgical History:  Procedure Laterality Date   ABDOMINAL HYSTERECTOMY     BACK SURGERY     CATARACT EXTRACTION W/PHACO Left 10/20/2019   Procedure: CATARACT EXTRACTION PHACO AND INTRAOCULAR LENS PLACEMENT (IOC) LEFT ISTENT INJ 7.13  00:42.2;  Surgeon: Eulogio Bear, MD;  Location: [place];  Service: Ophthalmology;  Laterality: Left;   CATARACT EXTRACTION W/PHACO Right 11/10/2019   Procedure: CATARACT EXTRACTION PHACO AND INTRAOCULAR LENS PLACEMENT (IOC) RIGHT;  Surgeon: Eulogio Bear, MD;  Location: [place];  Service: Ophthalmology;  Laterality: Right;  5.52 0:44.0   TONSILLECTOMY     TUBAL LIGATION      There were no vitals filed for this visit.   Subjective Assessment - 09/20/20 1021     Subjective Patient reports 2-3/10 pain at arrival to PT. She followed up with physician at end of last week and discussed  treatment options including genicular nerve block. She reports feeling fairly well at arrival to PT today. Pt reports tolerating last session well.    Pertinent History Patient is a 80 year old female referred for bilateral knee osteoarthritis. Patient thought her main issue was her back. She has Hx of lumbar spine fusion about 5 years ago. Patient reports having difficulty with getting around, and she thought her back was the main issue at the time. Patient reports minor pain affecting her back and bilateral knees at this time. Patient reports some cramping in her calves; patient does not report numbness/paresthesias. Patient reports she has been dealing with pain for about 1 year. She reports working on exercise e.g. using bike to improve pain. No recent trauma. No surgical history in bilateral knees. Worse in the evenings.    Limitations Standing;Other (comment)   sit to stand   Diagnostic tests No recent imaging on hip or lumbar spine. Recent knee radiographs demonstrating degenerative change.    Patient Stated Goals Improved ability to "get around"               TREATMENT     Therapeutic exercise - exercises to address ROM deficits, strengthening as needed for carryover to performance of transferring/stair negotiation/functional activities, soft tissue mobility     NuStep, Level 3 resistance; 5 minutes; seat 6, arms  6 - for knee complex ROM, and subjective information gathered during this time   SLR; 2x10 each side with 2-lb cuff weight   Bridge; 2x10      *not today* Swiss ball lumbar spine rollout, large blue physioball; x5 each direction, 3 sec hold Short arc quad; 2x10 each LE with 4-lb cuff weight  Heel slide with yellow physioball; x15 each LE  Lower trunk rotations, hooklying; 1x10 alternating In // bars: Standing 3-way hip; x5 each direction; bilateral LE, with 2 lb ankle weights on each leg    Seated hip abduction with Tband, Green Tband; 2x10 Straight leg raise;  supine with opposite knee flexed, 2x10, bilat      Manual Therapy - for symptom modulation, soft tissue sensitivity and mobility to improve knee complex ROM, patellofemoral joint mobility, ROM    (Emphasis on R knee>L knee today) Knee PROM within patient tolerance Patellar mobilization, grade III all planes Grade I-II posterior tibiofemoral mobilization for improved pain with flexion     *not today* STM and IASTM with Hypervolt along bilateral lumbar paraspinals L1-L4  STM IASTM with Theraband roller: rectus femoris and VMO, medial>lateral hamstrings     Therapeutic activities - patient education; functional activities to improve performance of ADLs and transfers     At base of stairs: Standing toe tap; 2x10 alternating, on Airex with tap on 6-inch step, hands near handrails bilat; standby assist  Stair ascent and descent x 2 with reciprocal stepping and unilateral handrail support (R side going up)  Along agility ladder:  Side step with Red Tband (tied at distal shins); x2 Down and back length of blue agility ladder           *not today* Hurdle step-over, 3 6-inch hurdles; x5 DB-Sit to stand, raised-height table; 2x8 (22" table height)  -Total Gym half squat, level 22; 20x -Lateral step-up; x10 each LE, 6-inch step -Standing heel raise; 2x10, handrails bilat       ASSESSMENT Patient has minimal low back pain this AM - she is continuing Voltaren gel daily for this. She reports mild knee pain this AM and feels she is making good progress. Pt discussed with referring provider potentially undergoing genicular nerve block; pt has not yet made appointment for this and feels she may need to postpone undergoing this procedure. She reports mild knee pain at this time and has progressed well with activities in clinic since flare-up and joint effusion in early July. She has remaining deficits in LE strength, patellar mobility, joint edema, R knee and thoracolumbar paraspinal pain, and  difficulties with transferring and stair/obstacle negotiation. Pt will benefit from continued skilled PT intervention to address pain, mobility deficits, strength, and ability to perform transferring tasks as needed for best return of function and improved QoL.    PT Short Term Goals - 09/01/20 1312       PT SHORT TERM GOAL #1   Title Patient will be indepedent and 100% compliant with established HEP and activity modification as needed to augment PT intervention and improve strength as needed for accessing commuinty    Baseline HEP given at IE 07/14/2020; 08/04/20: Patient is compliant with HEP; 09/01/20: patient stopped HEP during significant joint effusion, she has returned to home exercise at this time    Time 3    Period Weeks    Status Achieved    Target Date 08/03/20      PT SHORT TERM GOAL #2   Title Patient will have full knee ROM bilaterally  without reproduction of pain as needed for managing lower extremity during bed mobility and self-care activities e.g. dressing, bathing    Baseline IE: L knee flexion 110; 08/04/20: R and L knee flexion to 118 deg. 09/01/20: Knee flexion to 114 bilaterally, pain with end-range R knee flexion    Time 3    Period Weeks    Status Partially Met    Target Date 09/22/20               PT Long Term Goals - 09/01/20 0807       PT LONG TERM GOAL #1   Title Patient will demonstrate improved function as evidenced by a score of 54 on FOTO measure for full participation in activities at home and in the community.    Baseline FOTO 40 at IE 07/13/2020; 09/01/20: 31    Time 8    Period Weeks    Status Not Met      PT LONG TERM GOAL #2   Title Patient will perform sit to stand without UE support and no reproduction of pain as needed for transferring and home and community-level mobility    Baseline IE: heavy UE support and difficulty with initiating lift-off of pelvis during sit to stand. 09/01/20: performed with minimal assist, performed without assist  prior to R knee flare-up    Time 8    Period Weeks    Status Partially Met    Target Date 09/29/20      PT LONG TERM GOAL #3   Title Patient will have MMT4+/5 or greater for all tested LE musculature indicative of improved strength as needed for ability to perform transferring, stair/obstacle negotiation, and for ability to produce power sufficient for prevention of fall in event of large postural perturbation    Baseline IE: MMT 4- hip flexion, quad, HS. MMT 4 for hip abduction, ankle inversion and eversion.   09/01/20: remaining weakness in hip flexors, R quad, R HS, R ankle inversion.    Time 8    Period Weeks    Status Partially Met    Target Date 09/29/20      PT LONG TERM GOAL #4   Title Patient will independently perform stair ascent/descent with reciprocal stepping pattern and unilateral upper extremity support mimicking entering/existing home (3 steps to enter/exit) with no LOB/knee buckling    Baseline IE: Difficulty with negotiating step-up onto carport and stairs to enter/exit home.   09/01/20: deferred.    Time 8    Period Weeks    Status Deferred    Target Date 09/29/20                   Plan - 09/20/20 1311     Clinical Impression Statement Patient has minimal low back pain this AM - she is continuing Voltaren gel daily for this. She reports mild knee pain this AM and feels she is making good progress. Pt discussed with referring provider potentially undergoing genicular nerve block; pt has not yet made appointment for this and feels she may need to post-pone undergoing this procedure. She reports mild knee pain at this time and has progressed well with activities in clinic since flare-up and joint effusion in early July. She has remaining deficits in LE strength, patellar mobility, joint edema, R knee and thoracolumbar paraspinal pain, and difficulties with transferring and stair/obstacle negotiation. Pt will benefit from continued skilled PT intervention to address  pain, mobility deficits, strength, and ability to perform transferring tasks as needed  for best return of function and improved QoL.    Personal Factors and Comorbidities Age;Comorbidity 3+;Time since onset of injury/illness/exacerbation;Other   Patient lives alone   Examination-Activity Limitations Bed Mobility;Transfers;Sit;Locomotion Level;Stairs    Examination-Participation Restrictions Community Activity;Interpersonal Relationship;Shop    Stability/Clinical Decision Making Evolving/Moderate complexity    Rehab Potential Good    PT Frequency 2x / week    PT Duration 8 weeks    PT Treatment/Interventions Therapeutic activities;Therapeutic exercise;Neuromuscular re-education;Patient/family education;Manual techniques;Passive range of motion;Gait training    PT Next Visit Plan Continue with work on patellar/tibiofemoral mobility deficits, STM, knee ROM, LE strengthening, gentle L-spine mobility work. Maintaining low impact program until R knee pain is improved.    PT Home Exercise Plan HEP provided - Access Code [email or web address]    Consulted and Agree with Plan of Care Patient             Patient will benefit from skilled therapeutic intervention in order to improve the following deficits and impairments:  Hypomobility, Decreased strength, Decreased range of motion, Difficulty walking, Pain  Visit Diagnosis: Chronic pain of left knee  Chronic pain of right knee  Muscle weakness (generalized)  Difficulty in walking, not elsewhere classified     Problem List There are no problems to display for this patient.  Valentina Gu, PT, DPT #[id number]  Eilleen Kempf 09/20/2020, 1:11 PM  [facility] [address] [place], [place], [address] Phone: [phone]   Fax:  [phone]  Name: Natasha Walker MRN: [id number] Date of Birth: 1940/09/24

## 2020-09-22 ENCOUNTER — Ambulatory Visit: Payer: Medicare Other | Admitting: Physical Therapy

## 2020-09-22 ENCOUNTER — Encounter: Payer: Self-pay | Admitting: Physical Therapy

## 2020-09-22 ENCOUNTER — Other Ambulatory Visit: Payer: Self-pay

## 2020-09-22 DIAGNOSIS — M25562 Pain in left knee: Secondary | ICD-10-CM

## 2020-09-22 DIAGNOSIS — G8929 Other chronic pain: Secondary | ICD-10-CM

## 2020-09-22 DIAGNOSIS — M6281 Muscle weakness (generalized): Secondary | ICD-10-CM

## 2020-09-22 DIAGNOSIS — R262 Difficulty in walking, not elsewhere classified: Secondary | ICD-10-CM

## 2020-09-22 NOTE — Therapy (Signed)
[facility] [address] [place], [place], [address] Phone: [phone]   Fax:  [phone]  Physical Therapy Treatment  Patient Details  Name: Natasha Walker MRN: [id number] Date of Birth: September 10, 1940 Referring Provider (PT): Melida Quitter, [place]   Encounter Date: 09/22/2020   PT End of Session - 09/22/20 1018     Visit Number 14    Number of Visits 17    Date for PT Re-Evaluation 09/07/20    Authorization Time Period IE 07/13/2020, progress note 8/78, cert 6/76-7/20    Progress Note Due on Visit 17    PT Start Time 1015    PT Stop Time 1057    PT Time Calculation (min) 42 min    Activity Tolerance Patient tolerated treatment well    Behavior During Therapy [facility] for tasks assessed/performed             Past Medical History:  Diagnosis Date   COPD (chronic obstructive pulmonary disease) ([place])    Hypercholesteremia    Hypertension    TIA (transient ischemic attack)    several yrs ago. No deficits.   Vertigo    none recently   Wears dentures    partial upper    Past Surgical History:  Procedure Laterality Date   ABDOMINAL HYSTERECTOMY     BACK SURGERY     CATARACT EXTRACTION W/PHACO Left 10/20/2019   Procedure: CATARACT EXTRACTION PHACO AND INTRAOCULAR LENS PLACEMENT (IOC) LEFT ISTENT INJ 7.13  00:42.2;  Surgeon: Eulogio Bear, MD;  Location: [place];  Service: Ophthalmology;  Laterality: Left;   CATARACT EXTRACTION W/PHACO Right 11/10/2019   Procedure: CATARACT EXTRACTION PHACO AND INTRAOCULAR LENS PLACEMENT (IOC) RIGHT;  Surgeon: Eulogio Bear, MD;  Location: [place];  Service: Ophthalmology;  Laterality: Right;  5.52 0:44.0   TONSILLECTOMY     TUBAL LIGATION      There were no vitals filed for this visit.   Subjective Assessment - 09/22/20 1016     Subjective Patient reports 2/10 pain in her R knee; 1/10 pain in her back this AM. She states she has not utilized Voltaren gel  today. She reports compliance with her home exercise program. She reports no major soreness after her last visit. She reports no other major changes today. Pt denies recent buckling of her R knee.    Pertinent History Patient is a 80 year old female referred for bilateral knee osteoarthritis. Patient thought her main issue was her back. She has Hx of lumbar spine fusion about 5 years ago. Patient reports having difficulty with getting around, and she thought her back was the main issue at the time. Patient reports minor pain affecting her back and bilateral knees at this time. Patient reports some cramping in her calves; patient does not report numbness/paresthesias. Patient reports she has been dealing with pain for about 1 year. She reports working on exercise e.g. using bike to improve pain. No recent trauma. No surgical history in bilateral knees. Worse in the evenings.    Limitations Standing;Other (comment)   sit to stand   Diagnostic tests No recent imaging on hip or lumbar spine. Recent knee radiographs demonstrating degenerative change.    Patient Stated Goals Improved ability to "get around"                TREATMENT     Therapeutic exercise - exercises to address ROM deficits, strengthening as needed for carryover to performance of transferring/stair negotiation/functional activities, soft  tissue mobility     NuStep, Level 3 resistance; 5 minutes; seat 6, arms 6 - for knee complex ROM, and subjective information gathered during this time   SLR; 2x10 each side with 2-lb cuff weight   Bridge; 2x10       *not today* Swiss ball lumbar spine rollout, large blue physioball; x5 each direction, 3 sec hold Short arc quad; 2x10 each LE with 4-lb cuff weight  Heel slide with yellow physioball; x15 each LE  Lower trunk rotations, hooklying; 1x10 alternating In // bars: Standing 3-way hip; x5 each direction; bilateral LE, with 2 lb ankle weights on each leg    Seated hip abduction  with Tband, Green Tband; 2x10 Straight leg raise; supine with opposite knee flexed, 2x10, bilat      Manual Therapy - for symptom modulation, soft tissue sensitivity and mobility to improve knee complex ROM, patellofemoral joint mobility, ROM     (Emphasis on R knee>L knee today) Knee PROM within patient tolerance Patellar mobilization, grade III all planes Grade I-II posterior tibiofemoral mobilization for improved pain with flexion     *not today* STM and IASTM with Hypervolt along bilateral lumbar paraspinals L1-L4  STM IASTM with Theraband roller: rectus femoris and VMO, medial>lateral hamstrings     Therapeutic activities - patient education; functional activities to improve performance of ADLs and transfers     At base of stairs: Standing toe tap; 2x10 alternating, on Airex with tap on 6-inch step, hands near handrails bilat; standby assist  Minisquat; 2x10  Along agility ladder:  Side step with Red Tband (tied at distal shins); x2 Down and back length of blue agility ladder   Patient education: HEP update and review         *not today* Stair ascent and descent x 2 with reciprocal stepping and unilateral handrail support (R side going up) Hurdle step-over, 3 6-inch hurdles; x5 DB-Sit to stand, raised-height table; 2x8 (22" table height)  -Total Gym half squat, level 22; 20x -Lateral step-up; x10 each LE, 6-inch step -Standing heel raise; 2x10, handrails bilat       ASSESSMENT Patient has mild pain at this time and does not have significant R knee joint line edema. She tolerates modest increase in low-impact CKC activities today with no notable reproduction of pain or swelling. She is making excellent progress at this time in spite of history of flare-up and significant R knee effusion in July. She has remaining deficits in LE strength, patellar mobility, joint edema, R knee and thoracolumbar paraspinal pain, and difficulties with transferring and stair/obstacle  negotiation. Pt will benefit from continued skilled PT intervention to address pain, mobility deficits, strength, and ability to perform transferring tasks as needed for best return of function and improved QoL.        PT Short Term Goals - 09/01/20 1312       PT SHORT TERM GOAL #1   Title Patient will be indepedent and 100% compliant with established HEP and activity modification as needed to augment PT intervention and improve strength as needed for accessing commuinty    Baseline HEP given at IE 07/14/2020; 08/04/20: Patient is compliant with HEP; 09/01/20: patient stopped HEP during significant joint effusion, she has returned to home exercise at this time    Time 3    Period Weeks    Status Achieved    Target Date 08/03/20      PT SHORT TERM GOAL #2   Title Patient will have full knee ROM  bilaterally without reproduction of pain as needed for managing lower extremity during bed mobility and self-care activities e.g. dressing, bathing    Baseline IE: L knee flexion 110; 08/04/20: R and L knee flexion to 118 deg. 09/01/20: Knee flexion to 114 bilaterally, pain with end-range R knee flexion    Time 3    Period Weeks    Status Partially Met    Target Date 09/22/20               PT Long Term Goals - 09/01/20 0807       PT LONG TERM GOAL #1   Title Patient will demonstrate improved function as evidenced by a score of 54 on FOTO measure for full participation in activities at home and in the community.    Baseline FOTO 40 at IE 07/13/2020; 09/01/20: 31    Time 8    Period Weeks    Status Not Met      PT LONG TERM GOAL #2   Title Patient will perform sit to stand without UE support and no reproduction of pain as needed for transferring and home and community-level mobility    Baseline IE: heavy UE support and difficulty with initiating lift-off of pelvis during sit to stand. 09/01/20: performed with minimal assist, performed without assist prior to R knee flare-up    Time 8     Period Weeks    Status Partially Met    Target Date 09/29/20      PT LONG TERM GOAL #3   Title Patient will have MMT4+/5 or greater for all tested LE musculature indicative of improved strength as needed for ability to perform transferring, stair/obstacle negotiation, and for ability to produce power sufficient for prevention of fall in event of large postural perturbation    Baseline IE: MMT 4- hip flexion, quad, HS. MMT 4 for hip abduction, ankle inversion and eversion.   09/01/20: remaining weakness in hip flexors, R quad, R HS, R ankle inversion.    Time 8    Period Weeks    Status Partially Met    Target Date 09/29/20      PT LONG TERM GOAL #4   Title Patient will independently perform stair ascent/descent with reciprocal stepping pattern and unilateral upper extremity support mimicking entering/existing home (3 steps to enter/exit) with no LOB/knee buckling    Baseline IE: Difficulty with negotiating step-up onto carport and stairs to enter/exit home.   09/01/20: deferred.    Time 8    Period Weeks    Status Deferred    Target Date 09/29/20                   Plan - 09/23/20 1252     Clinical Impression Statement Patient has mild pain at this time and does not have significant R knee joint line edema. She tolerates modest increase in low-impact CKC activities today with no notable reproduction of pain or swelling. She is making excellent progress at this time in spite of history of flare-up and significant R knee effusion in July. She has remaining deficits in LE strength, patellar mobility, joint edema, R knee and thoracolumbar paraspinal pain, and difficulties with transferring and stair/obstacle negotiation. Pt will benefit from continued skilled PT intervention to address pain, mobility deficits, strength, and ability to perform transferring tasks as needed for best return of function and improved QoL.    Personal Factors and Comorbidities Age;Comorbidity 3+;Time since onset  of injury/illness/exacerbation;Other   Patient lives alone  Examination-Activity Limitations Bed Mobility;Transfers;Sit;Locomotion Level;Stairs    Examination-Participation Restrictions Community Activity;Interpersonal Relationship;Shop    Stability/Clinical Decision Making Evolving/Moderate complexity    Rehab Potential Good    PT Frequency 2x / week    PT Duration 8 weeks    PT Treatment/Interventions Therapeutic activities;Therapeutic exercise;Neuromuscular re-education;Patient/family education;Manual techniques;Passive range of motion;Gait training    PT Next Visit Plan Continue with work on patellar/tibiofemoral mobility deficits, STM, knee ROM, LE strengthening, gentle L-spine mobility work. Maintaining low impact program until R knee pain is improved.    PT Home Exercise Plan HEP provided - Access Code [email or web address]    Consulted and Agree with Plan of Care Patient             Patient will benefit from skilled therapeutic intervention in order to improve the following deficits and impairments:  Hypomobility, Decreased strength, Decreased range of motion, Difficulty walking, Pain  Visit Diagnosis: Chronic pain of left knee  Chronic pain of right knee  Muscle weakness (generalized)  Difficulty in walking, not elsewhere classified     Problem List There are no problems to display for this patient.  Valentina Gu, PT, DPT #[id number]  Eilleen Kempf 09/23/2020, 12:52 PM  [place] [facility] [address] [place], [place], [address] Phone: [phone]   Fax:  [phone]  Name: Natasha Walker MRN: [id number] Date of Birth: 03-13-1940

## 2020-09-27 ENCOUNTER — Ambulatory Visit: Payer: Medicare Other | Admitting: Physical Therapy

## 2020-09-27 ENCOUNTER — Other Ambulatory Visit: Payer: Self-pay

## 2020-09-27 ENCOUNTER — Encounter: Payer: Self-pay | Admitting: Physical Therapy

## 2020-09-27 DIAGNOSIS — R262 Difficulty in walking, not elsewhere classified: Secondary | ICD-10-CM

## 2020-09-27 DIAGNOSIS — M25562 Pain in left knee: Secondary | ICD-10-CM

## 2020-09-27 DIAGNOSIS — G8929 Other chronic pain: Secondary | ICD-10-CM

## 2020-09-27 DIAGNOSIS — M6281 Muscle weakness (generalized): Secondary | ICD-10-CM

## 2020-09-27 NOTE — Therapy (Addendum)
[place] [facility] [address] [place], [place], [address] Phone: [phone]   Fax:  [phone]  Physical Therapy Treatment  Patient Details  Name: Natasha Walker MRN: [id number] Date of Birth: 1940/05/26 Referring Provider (PT): Melida Quitter, [place]   Encounter Date: 09/27/2020   PT End of Session - 09/27/20 0919     Visit Number 15    Number of Visits 17    Date for PT Re-Evaluation 09/07/20    Authorization Time Period IE 07/13/2020, progress note 6/44, cert 0/34-7/42    Progress Note Due on Visit 57    PT Start Time 0925    PT Stop Time 1008    PT Time Calculation (min) 43 min    Activity Tolerance Patient tolerated treatment well    Behavior During Therapy [facility] for tasks assessed/performed             Past Medical History:  Diagnosis Date   COPD (chronic obstructive pulmonary disease) ([place])    Hypercholesteremia    Hypertension    TIA (transient ischemic attack)    several yrs ago. No deficits.   Vertigo    none recently   Wears dentures    partial upper    Past Surgical History:  Procedure Laterality Date   ABDOMINAL HYSTERECTOMY     BACK SURGERY     CATARACT EXTRACTION W/PHACO Left 10/20/2019   Procedure: CATARACT EXTRACTION PHACO AND INTRAOCULAR LENS PLACEMENT (IOC) LEFT ISTENT INJ 7.13  00:42.2;  Surgeon: Eulogio Bear, MD;  Location: [place];  Service: Ophthalmology;  Laterality: Left;   CATARACT EXTRACTION W/PHACO Right 11/10/2019   Procedure: CATARACT EXTRACTION PHACO AND INTRAOCULAR LENS PLACEMENT (IOC) RIGHT;  Surgeon: Eulogio Bear, MD;  Location: [place];  Service: Ophthalmology;  Laterality: Right;  5.52 0:44.0   TONSILLECTOMY     TUBAL LIGATION      There were no vitals filed for this visit.   Subjective Assessment - 09/27/20 0920     Subjective Patient reports feeling well this AM. She reports "low-grade" pain affecting her back and "low-grade" pain affecting  her R knee. Patient reports no notable issues after her last visit. Patient reports doing well with updated home program. Pt states her back is "not really" bothering her at arrival to PT.    Pertinent History Patient is a 80 year old female referred for bilateral knee osteoarthritis. Patient thought her main issue was her back. She has Hx of lumbar spine fusion about 5 years ago. Patient reports having difficulty with getting around, and she thought her back was the main issue at the time. Patient reports minor pain affecting her back and bilateral knees at this time. Patient reports some cramping in her calves; patient does not report numbness/paresthesias. Patient reports she has been dealing with pain for about 1 year. She reports working on exercise e.g. using bike to improve pain. No recent trauma. No surgical history in bilateral knees. Worse in the evenings.    Limitations Standing;Other (comment)   sit to stand   Diagnostic tests No recent imaging on hip or lumbar spine. Recent knee radiographs demonstrating degenerative change.    Patient Stated Goals Improved ability to "get around"    Currently in Pain? Yes    Pain Score 3              Knee AROM R 0-120 L 0-117    TREATMENT     Therapeutic exercise - exercises to address  ROM deficits, strengthening as needed for carryover to performance of transferring/stair negotiation/functional activities, soft tissue mobility     NuStep, Level 4 resistance; 5 minutes; seat 6, arms 6 - for knee complex ROM, and subjective information gathered during this time   SLR; 2x8 each side with 3-lb cuff weight     In // bars: Dynamic march; x4 D/B length of bars    *not today* Bridge; 2x10 Swiss ball lumbar spine rollout, large blue physioball; x5 each direction, 3 sec hold Short arc quad; 2x10 each LE with 4-lb cuff weight  Heel slide with yellow physioball; x15 each LE  Lower trunk rotations, hooklying; 1x10 alternating In // bars:  Standing 3-way hip; x5 each direction; bilateral LE, with 2 lb ankle weights on each leg    Seated hip abduction with Tband, Green Tband; 2x10 Straight leg raise; supine with opposite knee flexed, 2x10, bilat      Manual Therapy - for symptom modulation, soft tissue sensitivity and mobility to improve knee complex ROM, patellofemoral joint mobility, ROM     (Emphasis on R knee>L knee today) Knee PROM within patient tolerance Patellar mobilization, grade III all planes Grade I-II posterior tibiofemoral mobilization for improved pain with flexion     *not today* STM and IASTM with Hypervolt along bilateral lumbar paraspinals L1-L4  STM IASTM with Theraband roller: rectus femoris and VMO, medial>lateral hamstrings     Therapeutic activities - patient education; functional activities to improve performance of ADLs and transfers     Minisquat; 2x10   Along agility ladder:  Side step with Red Tband (tied at distal shins); x3 Down and back length of blue agility ladder   Patient education: discussed goals of PT, expectations with final visits in therapy, edema management          *not today* At base of stairs: Standing toe tap; 2x10 alternating, on Airex with tap on 6-inch step, hands near handrails bilat; standby assist Stair ascent and descent x 2 with reciprocal stepping and unilateral handrail support (R side going up) Hurdle step-over, 3 6-inch hurdles; x5 DB-Sit to stand, raised-height table; 2x8 (22" table height)  -Total Gym half squat, level 22; 20x -Lateral step-up; x10 each LE, 6-inch step -Standing heel raise; 2x10, handrails bilat       ASSESSMENT Patient has relatively mild pain at this time and is tolerating modest progression of weightbearing activity in clinic well. She has minimal R knee edema and exhibits functional ROM bilaterally. She has minimal back pain that is managed in AM with use of Voltaren gel. Patient is making excellent progress at this time in  spite of flare-up and significant effusion in early July. Pt will benefit from continued skilled PT intervention to address pain, mobility deficits, strength, and ability to perform transferring tasks as needed for best return of function and improved QoL.              PT Short Term Goals - 09/01/20 1312       PT SHORT TERM GOAL #1   Title Patient will be indepedent and 100% compliant with established HEP and activity modification as needed to augment PT intervention and improve strength as needed for accessing commuinty    Baseline HEP given at IE 07/14/2020; 08/04/20: Patient is compliant with HEP; 09/01/20: patient stopped HEP during significant joint effusion, she has returned to home exercise at this time    Time 3    Period Weeks    Status Achieved  Target Date 08/03/20      PT SHORT TERM GOAL #2   Title Patient will have full knee ROM bilaterally without reproduction of pain as needed for managing lower extremity during bed mobility and self-care activities e.g. dressing, bathing    Baseline IE: L knee flexion 110; 08/04/20: R and L knee flexion to 118 deg. 09/01/20: Knee flexion to 114 bilaterally, pain with end-range R knee flexion    Time 3    Period Weeks    Status Partially Met    Target Date 09/22/20               PT Long Term Goals - 09/01/20 0807       PT LONG TERM GOAL #1   Title Patient will demonstrate improved function as evidenced by a score of 54 on FOTO measure for full participation in activities at home and in the community.    Baseline FOTO 40 at IE 07/13/2020; 09/01/20: 31    Time 8    Period Weeks    Status Not Met      PT LONG TERM GOAL #2   Title Patient will perform sit to stand without UE support and no reproduction of pain as needed for transferring and home and community-level mobility    Baseline IE: heavy UE support and difficulty with initiating lift-off of pelvis during sit to stand. 09/01/20: performed with minimal assist, performed  without assist prior to R knee flare-up    Time 8    Period Weeks    Status Partially Met    Target Date 09/29/20      PT LONG TERM GOAL #3   Title Patient will have MMT4+/5 or greater for all tested LE musculature indicative of improved strength as needed for ability to perform transferring, stair/obstacle negotiation, and for ability to produce power sufficient for prevention of fall in event of large postural perturbation    Baseline IE: MMT 4- hip flexion, quad, HS. MMT 4 for hip abduction, ankle inversion and eversion.   09/01/20: remaining weakness in hip flexors, R quad, R HS, R ankle inversion.    Time 8    Period Weeks    Status Partially Met    Target Date 09/29/20      PT LONG TERM GOAL #4   Title Patient will independently perform stair ascent/descent with reciprocal stepping pattern and unilateral upper extremity support mimicking entering/existing home (3 steps to enter/exit) with no LOB/knee buckling    Baseline IE: Difficulty with negotiating step-up onto carport and stairs to enter/exit home.   09/01/20: deferred.    Time 8    Period Weeks    Status Deferred    Target Date 09/29/20                   Plan - 09/27/20 1009     Clinical Impression Statement Patient has relatively mild pain at this time and is tolerating modest progression of weightbearing activity in clinic well. She has minimal R knee edema and exhibits functional ROM bilaterally. She has minimal back pain that is managed in AM with use of Voltaren gel. Patient is making excellent progress at this time in spite of flare-up and significant effusion in early July. Pt will benefit from continued skilled PT intervention to address pain, mobility deficits, strength, and ability to perform transferring tasks as needed for best return of function and improved QoL.    Personal Factors and Comorbidities Age;Comorbidity 3+;Time since onset of injury/illness/exacerbation;Other  Patient lives alone    Examination-Activity Limitations Bed Mobility;Transfers;Sit;Locomotion Level;Stairs    Examination-Participation Restrictions Community Activity;Interpersonal Relationship;Shop    Stability/Clinical Decision Making Evolving/Moderate complexity    Rehab Potential Good    PT Frequency 2x / week    PT Duration 8 weeks    PT Treatment/Interventions Therapeutic activities;Therapeutic exercise;Neuromuscular re-education;Patient/family education;Manual techniques;Passive range of motion;Gait training    PT Next Visit Plan Continue with work on patellar/tibiofemoral mobility deficits, STM, knee ROM, LE strengthening, gentle L-spine mobility work. Maintaining low impact program until R knee pain is improved.    PT Home Exercise Plan HEP provided - Access Code [email or web address]    Consulted and Agree with Plan of Care Patient             Patient will benefit from skilled therapeutic intervention in order to improve the following deficits and impairments:  Hypomobility, Decreased strength, Decreased range of motion, Difficulty walking, Pain  Visit Diagnosis: Chronic pain of left knee  Chronic pain of right knee  Muscle weakness (generalized)  Difficulty in walking, not elsewhere classified     Problem List There are no problems to display for this patient.  *Addended to fix error in exercise parameters Valentina Gu, PT, DPT [id number]  Eilleen Kempf 09/27/2020, 10:09 AM  [facility] [address] [place], [place], [address] Phone: [phone]   Fax:  [phone]  Name: SPENSER CONG MRN: [id number] Date of Birth: July 08, 1940

## 2020-09-29 ENCOUNTER — Other Ambulatory Visit: Payer: Self-pay

## 2020-09-29 ENCOUNTER — Ambulatory Visit: Payer: Medicare Other | Admitting: Physical Therapy

## 2020-09-29 DIAGNOSIS — M25562 Pain in left knee: Secondary | ICD-10-CM | POA: Diagnosis not present

## 2020-09-29 DIAGNOSIS — R262 Difficulty in walking, not elsewhere classified: Secondary | ICD-10-CM

## 2020-09-29 DIAGNOSIS — G8929 Other chronic pain: Secondary | ICD-10-CM

## 2020-09-29 DIAGNOSIS — M6281 Muscle weakness (generalized): Secondary | ICD-10-CM

## 2020-09-29 NOTE — Therapy (Signed)
[place] [address] [place], [place], [address] Phone: [phone]   Fax:  [phone]  Physical Therapy Treatment  Patient Details  Name: Natasha Walker MRN: [id number] Date of Birth: Jul 03, 1940 Referring Provider (PT): Melida Quitter, [place]   Encounter Date: 09/29/2020   PT End of Session - 09/30/20 1237     Visit Number 16    Number of Visits 18    Date for PT Re-Evaluation 09/07/20    Authorization Time Period IE 07/13/2020, progress note 0/73, cert 7/10-6/26    Progress Note Due on Visit 18    PT Start Time 0932    PT Stop Time 1013    PT Time Calculation (min) 41 min    Activity Tolerance Patient tolerated treatment well    Behavior During Therapy [facility] for tasks assessed/performed             Past Medical History:  Diagnosis Date   COPD (chronic obstructive pulmonary disease) ([place])    Hypercholesteremia    Hypertension    TIA (transient ischemic attack)    several yrs ago. No deficits.   Vertigo    none recently   Wears dentures    partial upper    Past Surgical History:  Procedure Laterality Date   ABDOMINAL HYSTERECTOMY     BACK SURGERY     CATARACT EXTRACTION W/PHACO Left 10/20/2019   Procedure: CATARACT EXTRACTION PHACO AND INTRAOCULAR LENS PLACEMENT (IOC) LEFT ISTENT INJ 7.13  00:42.2;  Surgeon: Eulogio Bear, MD;  Location: [place];  Service: Ophthalmology;  Laterality: Left;   CATARACT EXTRACTION W/PHACO Right 11/10/2019   Procedure: CATARACT EXTRACTION PHACO AND INTRAOCULAR LENS PLACEMENT (IOC) RIGHT;  Surgeon: Eulogio Bear, MD;  Location: [place];  Service: Ophthalmology;  Laterality: Right;  5.52 0:44.0   TONSILLECTOMY     TUBAL LIGATION      There were no vitals filed for this visit.   Subjective Assessment - 09/29/20 0937     Subjective She reports not having significant pain presently. She reports some difficulty with lifting her legs and thinks this  may be due to her weight. Patient reports compliance with her HEP. Patient reports not having notable soreness after last visit.    Pertinent History Patient is a 80 year old female referred for bilateral knee osteoarthritis. Patient thought her main issue was her back. She has Hx of lumbar spine fusion about 5 years ago. Patient reports having difficulty with getting around, and she thought her back was the main issue at the time. Patient reports minor pain affecting her back and bilateral knees at this time. Patient reports some cramping in her calves; patient does not report numbness/paresthesias. Patient reports she has been dealing with pain for about 1 year. She reports working on exercise e.g. using bike to improve pain. No recent trauma. No surgical history in bilateral knees. Worse in the evenings.    Limitations Standing;Other (comment)   sit to stand   Diagnostic tests No recent imaging on hip or lumbar spine. Recent knee radiographs demonstrating degenerative change.    Patient Stated Goals Improved ability to "get around"               TREATMENT     Therapeutic exercise - exercises to address ROM deficits, strengthening as needed for carryover to performance of transferring/stair negotiation/functional activities, soft tissue mobility     NuStep, Level 4 resistance; 5 minutes; seat 6, arms 7 - for  knee complex ROM, and subjective information gathered during this time   SLR; 2x8 each side with 3-lb cuff weight      In // bars: Dynamic march; x4 D/B length of bars     *not today* Bridge; 2x10 Swiss ball lumbar spine rollout, large blue physioball; x5 each direction, 3 sec hold Short arc quad; 2x10 each LE with 4-lb cuff weight  Heel slide with yellow physioball; x15 each LE  Lower trunk rotations, hooklying; 1x10 alternating In // bars: Standing 3-way hip; x5 each direction; bilateral LE, with 2 lb ankle weights on each leg    Seated hip abduction with Tband, Green  Tband; 2x10 Straight leg raise; supine with opposite knee flexed, 2x10, bilat      Manual Therapy - for symptom modulation, soft tissue sensitivity and mobility to improve knee complex ROM, patellofemoral joint mobility, ROM     (Emphasis on R knee>L knee today) Knee PROM within patient tolerance Patellar mobilization, grade III all planes Grade I-II posterior tibiofemoral mobilization for improved pain with flexion     *not today* STM and IASTM with Hypervolt along bilateral lumbar paraspinals L1-L4  STM IASTM with Theraband roller: rectus femoris and VMO, medial>lateral hamstrings     Therapeutic activities - patient education; functional activities to improve performance of ADLs and transfers     Minisquat; 2x10   Along agility ladder:  Side step with Red Tband (tied at distal shins); x3 Down and back length of blue agility ladder   Patient education: HEP update and review (see Access Code)           *not today* At base of stairs: Standing toe tap; 2x10 alternating, on Airex with tap on 6-inch step, hands near handrails bilat; standby assist Stair ascent and descent x 2 with reciprocal stepping and unilateral handrail support (R side going up) Hurdle step-over, 3 6-inch hurdles; x5 DB-Sit to stand, raised-height table; 2x8 (22" table height)  -Total Gym half squat, level 22; 20x -Lateral step-up; x10 each LE, 6-inch step -Standing heel raise; 2x10, handrails bilat       ASSESSMENT Patient has minimal pain at this time. She does not have significant tibiofemoral joint edema on either side and has tolerated slow progression of standing activities in clinic well. Pt has demonstrated safe obstacle and stair negotiation and vastly improved ability to perform sit to stand. Pt has 2 visits remaining with current POC; plan to establish advanced HEP and transition to independent program over the next week. Pt will benefit from continued skilled PT intervention to address  mobility deficits, strength, and ability to perform transferring tasks as needed for best return of function and improved QoL.        PT Short Term Goals - 09/01/20 1312       PT SHORT TERM GOAL #1   Title Patient will be indepedent and 100% compliant with established HEP and activity modification as needed to augment PT intervention and improve strength as needed for accessing commuinty    Baseline HEP given at IE 07/14/2020; 08/04/20: Patient is compliant with HEP; 09/01/20: patient stopped HEP during significant joint effusion, she has returned to home exercise at this time    Time 3    Period Weeks    Status Achieved    Target Date 08/03/20      PT SHORT TERM GOAL #2   Title Patient will have full knee ROM bilaterally without reproduction of pain as needed for managing lower extremity during bed mobility  and self-care activities e.g. dressing, bathing    Baseline IE: L knee flexion 110; 08/04/20: R and L knee flexion to 118 deg. 09/01/20: Knee flexion to 114 bilaterally, pain with end-range R knee flexion    Time 3    Period Weeks    Status Partially Met    Target Date 09/22/20               PT Long Term Goals - 09/01/20 0807       PT LONG TERM GOAL #1   Title Patient will demonstrate improved function as evidenced by a score of 54 on FOTO measure for full participation in activities at home and in the community.    Baseline FOTO 40 at IE 07/13/2020; 09/01/20: 31    Time 8    Period Weeks    Status Not Met      PT LONG TERM GOAL #2   Title Patient will perform sit to stand without UE support and no reproduction of pain as needed for transferring and home and community-level mobility    Baseline IE: heavy UE support and difficulty with initiating lift-off of pelvis during sit to stand. 09/01/20: performed with minimal assist, performed without assist prior to R knee flare-up    Time 8    Period Weeks    Status Partially Met    Target Date 09/29/20      PT LONG TERM GOAL #3    Title Patient will have MMT4+/5 or greater for all tested LE musculature indicative of improved strength as needed for ability to perform transferring, stair/obstacle negotiation, and for ability to produce power sufficient for prevention of fall in event of large postural perturbation    Baseline IE: MMT 4- hip flexion, quad, HS. MMT 4 for hip abduction, ankle inversion and eversion.   09/01/20: remaining weakness in hip flexors, R quad, R HS, R ankle inversion.    Time 8    Period Weeks    Status Partially Met    Target Date 09/29/20      PT LONG TERM GOAL #4   Title Patient will independently perform stair ascent/descent with reciprocal stepping pattern and unilateral upper extremity support mimicking entering/existing home (3 steps to enter/exit) with no LOB/knee buckling    Baseline IE: Difficulty with negotiating step-up onto carport and stairs to enter/exit home.   09/01/20: deferred.    Time 8    Period Weeks    Status Deferred    Target Date 09/29/20                   Plan - 09/30/20 1241     Clinical Impression Statement Patient has minimal pain at this time. She does not have significant tibiofemoral joint edema on either side and has tolerated slow progression of standing activities in clinic well. Pt has demonstrated safe obstacle and stair negotiation and vastly improved ability to perform sit to stand. Pt has 2 visits remaining with current POC; plan to establish advanced HEP and transition to independent program over the next week. Pt will benefit from continued skilled PT intervention to address mobility deficits, strength, and ability to perform transferring tasks as needed for best return of function and improved QoL.    Personal Factors and Comorbidities Age;Comorbidity 3+;Time since onset of injury/illness/exacerbation;Other   Patient lives alone   Examination-Activity Limitations Bed Mobility;Transfers;Sit;Locomotion Level;Stairs    Examination-Participation  Restrictions Community Activity;Interpersonal Relationship;Shop    Stability/Clinical Decision Making Evolving/Moderate complexity    Rehab  Potential Good    PT Frequency 2x / week    PT Duration 8 weeks    PT Treatment/Interventions Therapeutic activities;Therapeutic exercise;Neuromuscular re-education;Patient/family education;Manual techniques;Passive range of motion;Gait training    PT Next Visit Plan Continue with work on patellar/tibiofemoral mobility deficits, STM, knee ROM, LE strengthening, gentle L-spine mobility work. Maintaining low impact program until R knee pain is improved.    PT Home Exercise Plan HEP provided - Access Code [email or web address]    Consulted and Agree with Plan of Care Patient             Patient will benefit from skilled therapeutic intervention in order to improve the following deficits and impairments:  Hypomobility, Decreased strength, Decreased range of motion, Difficulty walking, Pain  Visit Diagnosis: Chronic pain of left knee  Chronic pain of right knee  Muscle weakness (generalized)  Difficulty in walking, not elsewhere classified     Problem List There are no problems to display for this patient.  Valentina Gu, PT, DPT #[id number]  Eilleen Kempf 09/30/2020, 12:41 PM  [place] [facility] [address] [place], [place], [address] Phone: [phone]   Fax:  [phone]  Name: Natasha Walker MRN: [id number] Date of Birth: [birth year]

## 2020-09-30 ENCOUNTER — Encounter: Payer: Self-pay | Admitting: Physical Therapy

## 2020-10-04 ENCOUNTER — Ambulatory Visit: Payer: Medicare Other | Admitting: Physical Therapy

## 2020-10-04 ENCOUNTER — Encounter: Payer: Self-pay | Admitting: Physical Therapy

## 2020-10-04 ENCOUNTER — Other Ambulatory Visit: Payer: Self-pay

## 2020-10-04 DIAGNOSIS — M6281 Muscle weakness (generalized): Secondary | ICD-10-CM

## 2020-10-04 DIAGNOSIS — M25561 Pain in right knee: Secondary | ICD-10-CM

## 2020-10-04 DIAGNOSIS — G8929 Other chronic pain: Secondary | ICD-10-CM

## 2020-10-04 DIAGNOSIS — M25562 Pain in left knee: Secondary | ICD-10-CM | POA: Diagnosis not present

## 2020-10-04 DIAGNOSIS — R262 Difficulty in walking, not elsewhere classified: Secondary | ICD-10-CM

## 2020-10-04 NOTE — Therapy (Signed)
[facility] [address] [place], [place], [address] Phone: [phone]   Fax:  [phone]  Physical Therapy Treatment/ Physical Therapy Progress Note   Dates of reporting period  09/01/20   to   10/04/20   Patient Details  Name: LACRECIA DELVAL MRN: [id number] Date of Birth: 1940-12-16 Referring Provider (PT): Melida Quitter, [place]   Encounter Date: 10/04/2020   PT End of Session - 10/04/20 0748     Visit Number 17    Number of Visits 18    Date for PT Re-Evaluation 09/07/20    Authorization Time Period IE 07/13/2020, progress note 4/96, cert 7/59-1/63    Progress Note Due on Visit 18    PT Start Time 0750    PT Stop Time 0835    PT Time Calculation (min) 45 min    Activity Tolerance Patient tolerated treatment well    Behavior During Therapy [facility] for tasks assessed/performed             Past Medical History:  Diagnosis Date   COPD (chronic obstructive pulmonary disease) ([place])    Hypercholesteremia    Hypertension    TIA (transient ischemic attack)    several yrs ago. No deficits.   Vertigo    none recently   Wears dentures    partial upper    Past Surgical History:  Procedure Laterality Date   ABDOMINAL HYSTERECTOMY     BACK SURGERY     CATARACT EXTRACTION W/PHACO Left 10/20/2019   Procedure: CATARACT EXTRACTION PHACO AND INTRAOCULAR LENS PLACEMENT (IOC) LEFT ISTENT INJ 7.13  00:42.2;  Surgeon: Eulogio Bear, MD;  Location: [place];  Service: Ophthalmology;  Laterality: Left;   CATARACT EXTRACTION W/PHACO Right 11/10/2019   Procedure: CATARACT EXTRACTION PHACO AND INTRAOCULAR LENS PLACEMENT (IOC) RIGHT;  Surgeon: Eulogio Bear, MD;  Location: [place];  Service: Ophthalmology;  Laterality: Right;  5.52 0:44.0   TONSILLECTOMY     TUBAL LIGATION      There were no vitals filed for this visit.   Subjective Assessment - 10/04/20 0759     Subjective Pt reports feeling well at  arrival to PT. She denies significant knee pain at arrival to PT. She reports 80% SANE score at arrival to PT. She reports having moderate low back pain this morning that impoved with use of Voltaren gel. Patient reports doing well with negotiating her cartport and being able to get into her home. Pt reports doing well wth functional mobility and home and community level. Patient reports compliance with her HEP.    Pertinent History Patient is a 80 year old female referred for bilateral knee osteoarthritis. Patient thought her main issue was her back. She has Hx of lumbar spine fusion about 5 years ago. Patient reports having difficulty with getting around, and she thought her back was the main issue at the time. Patient reports minor pain affecting her back and bilateral knees at this time. Patient reports some cramping in her calves; patient does not report numbness/paresthesias. Patient reports she has been dealing with pain for about 1 year. She reports working on exercise e.g. using bike to improve pain. No recent trauma. No surgical history in bilateral knees. Worse in the evenings.    Limitations Standing;Other (comment)   sit to stand   Diagnostic tests No recent imaging on hip or lumbar spine. Recent knee radiographs demonstrating degenerative change.    Patient Stated Goals Improved ability to "get around"  OBJECTIVE   Posture Forward head rounded shoulders posture   Lumbar/Hip AROM: Flexion minimally limited, minimal extension AROM; sidebending AROM WFL with Moderate contralateral flank pull/pain; thoracolumbar rotation AROM WFL R and 75% L     Gait Mild decreased terminal knee extension at end of swing phase, forward flexed posture throughout gait cycle   Palpation Tenderness to palpation along medial joint line R knee, L fibular head Tenderness to palpation along bilateral T10-L4 thoracic and lumbar paraspinals   Strength R/L 4-/4 Hip flexion 4+/4+ Hip  abduction (seated)  5/5 Hip adduction 4/4 Knee extension 4-/4 Knee flexion 4+/5 Ankle Dorsiflexion 4/4+ Ankle Plantarflexion 4/4+ Ankle Inversion 4+/4+ Ankle Eversion *indicates pain   AROM   Knee R/L Flexion: 117/117 Extension: -1/0 *indicates pain   Ankle R/L 50/50 Ankle Plantarflexion 12/15 Ankle Dorsiflexion *Indicates Pain   Passive Accessory Motion Superior Tibiofibular Joint: WNL Knee: Hypomobile posterior glide tibiofemoral joint  Patella: hypomobile in all directions bilaterally Ankle: WNL       FUNCTIONAL TESTS   Sit to stand: Min self-assist with upper limbs on anterior thighs for performing sit to stand with no pain behaviors; able to perform without upper limb support following cueing   Stair negotiation: reciprocal pattern for ascent and descent, no handrail support going up and unilateral handrail support for descent with no LOB and no increase in pain           TREATMENT    Therapeutic Activities - re-assessment; patient education; functional activities to improve performance of ADLs and transfers   Re-assessment performed (see above)   Stair ascent and descent x 2 with reciprocal stepping and unilateral handrail support (L side going up to mimic home setup)       *next visit*  Minisquat; 2x10   Along agility ladder:  Side step with Red Tband (tied at distal shins); x3 Down and back length of blue agility ladder      *not today* At base of stairs: Standing toe tap; 2x10 alternating, on Airex with tap on 6-inch step, hands near handrails bilat; standby assist Hurdle step-over, 3 6-inch hurdles; x5 DB-Sit to stand, raised-height table; 2x8 (22" table height)  Total Gym half squat, level 22; 20x Lateral step-up; x10 each LE, 6-inch step Standing heel raise; 2x10, handrails bilat   MHP (unbilled) utilized following re-assessment and prior to manual therapy for analgesic effect and improved soft tissue extensibility, x 5 minutes in  prone lying on thoracolumbar paraspinals   Manual Therapy - for symptom modulation, soft tissue sensitivity and mobility to improve knee complex ROM, patellofemoral joint mobility, ROM     (Emphasis on R knee>L knee today) Knee PROM within patient tolerance Patellar mobilization, grade III all planes Grade I-II posterior tibiofemoral mobilization for improved pain with flexion STM and IASTM with Hypervolt along bilateral lumbar paraspinals L1-L4    *not today*  STM IASTM with Theraband roller: rectus femoris and VMO, medial>lateral hamstrings    Therapeutic exercise - exercises to address ROM deficits, strengthening as needed for carryover to performance of transferring/stair negotiation/functional activities, soft tissue mobility     NuStep, Level 4 resistance; 5 minutes; seat 5, arms 6 - for knee complex ROM, and subjective information gathered during this time   Lower trunk rotations, hooklying; 1x10 alternating  Swiss ball lumbar spine rollout, large blue physioball; x5     *next visit* SLR; 2x8 each side with 3-lb cuff weight    In // bars: Dynamic march; x4 D/B length of bars     *  not today* Bridge; 2x10 each direction, 3 sec hold Short arc quad; 2x10 each LE with 4-lb cuff weight  Heel slide with yellow physioball; x15 each LE   In // bars: Standing 3-way hip; x5 each direction; bilateral LE, with 2 lb ankle weights on each leg    Seated hip abduction with Tband, Green Tband; 2x10 Straight leg raise; supine with opposite knee flexed, 2x10, bilat              ASSESSMENT Patient has met almost all established goals and has surpassed long-term FOTO goal; she is only lacking in goal for MMTs with weakness remaining in hip flexors, quadriceps and hamstrings, and R plantarflexors and ankle inversion. She is able to perform sit to stand without upper limb support with minimal challenge. She does report some "heaviness" with lifting her lower limbs subjective  report of feeling this more with weight gain and apparent deficits measured for quadriceps and hamstrings. She has no notable knee pain at this time and intermittent back pain s/p lumbar fusion that improves with topical Voltaren. She does need further work on strengthening and is appropriate for continued strengthening with her home program given current functional progress c PT. Pt has 1 visit remaining with current POC; plan to establish advanced HEP and transition to independent program following her next visit. Pt will benefit from continued skilled PT intervention to address mobility deficits, strength, and ability to perform transferring tasks as needed for best return of function and improved QoL.           PT Short Term Goals - 10/04/20 0839       PT SHORT TERM GOAL #1   Title Patient will be indepedent and 100% compliant with established HEP and activity modification as needed to augment PT intervention and improve strength as needed for accessing commuinty    Baseline HEP given at IE 07/14/2020; 08/04/20: Patient is compliant with HEP; 09/01/20: patient stopped HEP during significant joint effusion, she has returned to home exercise at this time    Time 3    Period Weeks    Status Achieved    Target Date 08/03/20      PT SHORT TERM GOAL #2   Title Patient will have full knee ROM bilaterally without reproduction of pain as needed for managing lower extremity during bed mobility and self-care activities e.g. dressing, bathing    Baseline IE: L knee flexion 110; 08/04/20: R and L knee flexion to 118 deg. 09/01/20: Knee flexion to 114 bilaterally, pain with end-range R knee flexion. 10/04/20: Knee flexion to 117 bilaterally without pain    Time 3    Period Weeks    Status Achieved    Target Date 09/22/20               PT Long Term Goals - 10/04/20 0757       PT LONG TERM GOAL #1   Title Patient will demonstrate improved function as evidenced by a score of 54 on FOTO measure for  full participation in activities at home and in the community.    Baseline FOTO 40 at IE 07/13/2020; 09/01/20: 31.  10/04/20: 78    Time 8    Period Weeks    Status Achieved    Target Date 10/06/20      PT LONG TERM GOAL #2   Title Patient will perform sit to stand without UE support and no reproduction of pain as needed for transferring and home and community-level mobility  Baseline IE: heavy UE support and difficulty with initiating lift-off of pelvis during sit to stand. 09/01/20: performed with minimal assist, performed without assist prior to R knee flare-up. 10/04/20: performed without UE support and no pain following cueing to perform without hands    Time 8    Period Weeks    Status Achieved    Target Date 09/29/20      PT LONG TERM GOAL #3   Title Patient will have MMT4+/5 or greater for all tested LE musculature indicative of improved strength as needed for ability to perform transferring, stair/obstacle negotiation, and for ability to produce power sufficient for prevention of fall in event of large postural perturbation    Baseline IE: MMT 4- hip flexion, quad, HS. MMT 4 for hip abduction, ankle inversion and eversion.   09/01/20: remaining weakness in hip flexors, R quad, R HS, R ankle inversion. 10/04/20: Remaining weakness hip flexors, quads, hamsrings, R plantarflexors, R ankle inversion    Time 8    Period Weeks    Status Partially Met    Target Date 10/06/20      PT LONG TERM GOAL #4   Title Patient will independently perform stair ascent/descent with reciprocal stepping pattern and unilateral upper extremity support mimicking entering/existing home (3 steps to enter/exit) with no LOB/knee buckling    Baseline IE: Difficulty with negotiating step-up onto carport and stairs to enter/exit home.   09/01/20: deferred. 10/04/20: Performed as described above with no LOB    Time 8    Period Weeks    Status Achieved    Target Date 09/29/20                   Plan -  10/04/20 1610     Clinical Impression Statement Patient has met almost all established goals and has surpassed long-term FOTO goal; she is only lacking in goal for MMTs with weakness remaining in hip flexors, quadriceps and hamstrings, and R plantarflexors and ankle inversion. She is able to perform sit to stand without upper limb support with minimal challenge. She does report some "heaviness" with lifting her lower limbs subjective report of feeling this more with weight gain and apparent deficits measured for quadriceps and hamstrings. She has no notable knee pain at this time and intermittent back pain s/p lumbar fusion that improves with topical Voltaren. She does need further work on strengthening and is appropriate for continued strengthening with her home program given current functional progress c PT. Pt has 1 visit remaining with current POC; plan to establish advanced HEP and transition to independent program following her next visit. Pt will benefit from continued skilled PT intervention to address mobility deficits, strength, and ability to perform transferring tasks as needed for best return of function and improved QoL.    Personal Factors and Comorbidities Age;Comorbidity 3+;Time since onset of injury/illness/exacerbation;Other   Patient lives alone   Examination-Activity Limitations Bed Mobility;Transfers;Sit;Locomotion Level;Stairs    Examination-Participation Restrictions Community Activity;Interpersonal Relationship;Shop    Stability/Clinical Decision Making Evolving/Moderate complexity    Rehab Potential Good    PT Frequency 2x / week    PT Duration 8 weeks    PT Treatment/Interventions Therapeutic activities;Therapeutic exercise;Neuromuscular re-education;Patient/family education;Manual techniques;Passive range of motion;Gait training    PT Next Visit Plan Continue with work on patellar/tibiofemoral mobility deficits, STM, knee ROM, LE strengthening, gentle L-spine mobility work.  Transition to independent home program following next visit.    PT Home Exercise Plan HEP provided - Access Code [email or web address]  Consulted and Agree with Plan of Care Patient             Patient will benefit from skilled therapeutic intervention in order to improve the following deficits and impairments:  Hypomobility, Decreased strength, Decreased range of motion, Difficulty walking, Pain  Visit Diagnosis: Chronic pain of left knee  Chronic pain of right knee  Muscle weakness (generalized)  Difficulty in walking, not elsewhere classified     Problem List There are no problems to display for this patient.  Valentina Gu, PT, DPT #[id number]  Eilleen Kempf 10/04/2020, 9:06 AM   [facility] [address] [place], [place], [address] Phone: [phone]   Fax:  [phone]  Name: NYKOLE MATOS MRN: [id number] Date of Birth: 1940-08-13

## 2020-10-06 ENCOUNTER — Encounter: Payer: Self-pay | Admitting: Physical Therapy

## 2020-10-06 ENCOUNTER — Other Ambulatory Visit: Payer: Self-pay

## 2020-10-06 ENCOUNTER — Ambulatory Visit: Payer: Medicare Other | Admitting: Physical Therapy

## 2020-10-06 DIAGNOSIS — R262 Difficulty in walking, not elsewhere classified: Secondary | ICD-10-CM

## 2020-10-06 DIAGNOSIS — G8929 Other chronic pain: Secondary | ICD-10-CM

## 2020-10-06 DIAGNOSIS — M6281 Muscle weakness (generalized): Secondary | ICD-10-CM

## 2020-10-06 DIAGNOSIS — M25562 Pain in left knee: Secondary | ICD-10-CM

## 2020-10-06 NOTE — Therapy (Signed)
[facility] [address] [place], [place], [address] Phone: [phone]   Fax:  [phone]  Physical Therapy Treatment  Patient Details  Name: Natasha Walker MRN: [id number] Date of Birth: Dec 24, 1940 Referring Provider (PT): Melida Quitter, [place]   Encounter Date: 10/06/2020   PT End of Session - 10/06/20 0759     Visit Number 18    Number of Visits 18    Date for PT Re-Evaluation 09/07/20    Authorization Time Period IE 07/13/2020, progress note 3/87, cert 5/64-3/32    Progress Note Due on Visit 18    PT Start Time 0751    PT Stop Time 0838    PT Time Calculation (min) 47 min    Activity Tolerance Patient tolerated treatment well    Behavior During Therapy [facility] for tasks assessed/performed             Past Medical History:  Diagnosis Date   COPD (chronic obstructive pulmonary disease) ([place])    Hypercholesteremia    Hypertension    TIA (transient ischemic attack)    several yrs ago. No deficits.   Vertigo    none recently   Wears dentures    partial upper    Past Surgical History:  Procedure Laterality Date   ABDOMINAL HYSTERECTOMY     BACK SURGERY     CATARACT EXTRACTION W/PHACO Left 10/20/2019   Procedure: CATARACT EXTRACTION PHACO AND INTRAOCULAR LENS PLACEMENT (IOC) LEFT ISTENT INJ 7.13  00:42.2;  Surgeon: Eulogio Bear, MD;  Location: [facility];  Service: Ophthalmology;  Laterality: Left;   CATARACT EXTRACTION W/PHACO Right 11/10/2019   Procedure: CATARACT EXTRACTION PHACO AND INTRAOCULAR LENS PLACEMENT (IOC) RIGHT;  Surgeon: Eulogio Bear, MD;  Location: [facility];  Service: Ophthalmology;  Laterality: Right;  5.52 0:44.0   TONSILLECTOMY     TUBAL LIGATION      There were no vitals filed for this visit.   Subjective Assessment - 10/06/20 0757     Subjective Patient denies notable pain this AM. She reports not having to use Voltaren gel for her low back and feels that  treatment earlier this week helped. Patient denies recent flare-up of pain and feels she tolerated last session well. Patient feels she is comfortable with continuing with independent home program as discussed last visit.    Pertinent History Patient is a 80 year old female referred for bilateral knee osteoarthritis. Patient thought her main issue was her back. She has Hx of lumbar spine fusion about 5 years ago. Patient reports having difficulty with getting around, and she thought her back was the main issue at the time. Patient reports minor pain affecting her back and bilateral knees at this time. Patient reports some cramping in her calves; patient does not report numbness/paresthesias. Patient reports she has been dealing with pain for about 1 year. She reports working on exercise e.g. using bike to improve pain. No recent trauma. No surgical history in bilateral knees. Worse in the evenings.    Limitations Standing;Other (comment)   sit to stand   Diagnostic tests No recent imaging on hip or lumbar spine. Recent knee radiographs demonstrating degenerative change.    Patient Stated Goals Improved ability to "get around"                  TREATMENT     Therapeutic Activities -  patient education; functional activities to improve performance of ADLs and transfers  3-way kick; x5 each  LE, ea dir   Standing toe tap; 2x10 alternating, on Airex with tap on 6-inch step, hands near handrails bilat; standby assist  Patient education: Discussed current progress; HEP update and review       *not today* Along agility ladder:  Side step with Red Tband (tied at distal shins); x3 Down and back length of blue agility ladder Stair ascent and descent x 2 with reciprocal stepping and unilateral handrail support (L side going up to mimic home setup) Minisquat; 2x10 At base of stairs:  Hurdle step-over, 3 6-inch hurdles; x5 DB-Sit to stand, raised-height table; 2x8 (22" table height)  Total Gym  half squat, level 22; 20x Lateral step-up; x10 each LE, 6-inch step Standing heel raise; 2x10, handrails bilat      Manual Therapy - for symptom modulation, soft tissue sensitivity and mobility to improve knee complex ROM, patellofemoral joint mobility, ROM     (Emphasis on R knee>L knee today) Knee PROM within patient tolerance Patellar mobilization, grade III all planes Grade I-II posterior tibiofemoral mobilization for improved pain with flexion STM and IASTM with Hypervolt along bilateral lumbar paraspinals L1-L4    *not today*   STM IASTM with Theraband roller: rectus femoris and VMO, medial>lateral hamstrings       Therapeutic exercise - exercises to address ROM deficits, strengthening as needed for carryover to performance of transferring/stair negotiation/functional activities, soft tissue mobility     NuStep, Level 4 resistance; 5 minutes; seat 6, arms 6 - for knee complex ROM, and subjective information gathered during this time    Bridge; with Green band above knees; 2x10   SLR; 2x10 each side with 3-lb cuff weight     In // bars: Dynamic march; x4 D/B length of bars       *not today* Lower trunk rotations, hooklying; 1x10 alternating Swiss ball lumbar spine rollout, large blue physioball; x5   each direction, 3 sec hold Short arc quad; 2x10 each LE with 4-lb cuff weight  Heel slide with yellow physioball; x15 each LE    In // bars: Standing 3-way hip; x5 each direction; bilateral LE, with 2 lb ankle weights on each leg    Seated hip abduction with Tband, Green Tband; 2x10 Straight leg raise; supine with opposite knee flexed, 2x10, bilat                ASSESSMENT Patient has made excellent progress with PT and reports minimal symptoms at this time in low back and her knees. She has met FOTO goal and all impairment-based and functional goals with only exception being MMT goal. Discussed with patient continued strengthening with IND HEP, and patient is  comfortable with moving forward independently. Pt to transition to advanced HEP and transition to independent program following her next visit. Pt will benefit from continued HEP to address remaining strength deficits and maintain QoL. Plan to discharge the current case following today's visit.       PT Short Term Goals - 10/04/20 0839       PT SHORT TERM GOAL #1   Title Patient will be indepedent and 100% compliant with established HEP and activity modification as needed to augment PT intervention and improve strength as needed for accessing commuinty    Baseline HEP given at IE 07/14/2020; 08/04/20: Patient is compliant with HEP; 09/01/20: patient stopped HEP during significant joint effusion, she has returned to home exercise at this time    Time 3    Period Weeks    Status  Achieved    Target Date 08/03/20      PT SHORT TERM GOAL #2   Title Patient will have full knee ROM bilaterally without reproduction of pain as needed for managing lower extremity during bed mobility and self-care activities e.g. dressing, bathing    Baseline IE: L knee flexion 110; 08/04/20: R and L knee flexion to 118 deg. 09/01/20: Knee flexion to 114 bilaterally, pain with end-range R knee flexion. 10/04/20: Knee flexion to 117 bilaterally without pain    Time 3    Period Weeks    Status Achieved    Target Date 09/22/20               PT Long Term Goals - 10/04/20 0757       PT LONG TERM GOAL #1   Title Patient will demonstrate improved function as evidenced by a score of 54 on FOTO measure for full participation in activities at home and in the community.    Baseline FOTO 40 at IE 07/13/2020; 09/01/20: 31.  10/04/20: 78    Time 8    Period Weeks    Status Achieved    Target Date 10/06/20      PT LONG TERM GOAL #2   Title Patient will perform sit to stand without UE support and no reproduction of pain as needed for transferring and home and community-level mobility    Baseline IE: heavy UE support and  difficulty with initiating lift-off of pelvis during sit to stand. 09/01/20: performed with minimal assist, performed without assist prior to R knee flare-up. 10/04/20: performed without UE support and no pain following cueing to perform without hands    Time 8    Period Weeks    Status Achieved    Target Date 09/29/20      PT LONG TERM GOAL #3   Title Patient will have MMT4+/5 or greater for all tested LE musculature indicative of improved strength as needed for ability to perform transferring, stair/obstacle negotiation, and for ability to produce power sufficient for prevention of fall in event of large postural perturbation    Baseline IE: MMT 4- hip flexion, quad, HS. MMT 4 for hip abduction, ankle inversion and eversion.   09/01/20: remaining weakness in hip flexors, R quad, R HS, R ankle inversion. 10/04/20: Remaining weakness hip flexors, quads, hamsrings, R plantarflexors, R ankle inversion    Time 8    Period Weeks    Status Partially Met    Target Date 10/06/20      PT LONG TERM GOAL #4   Title Patient will independently perform stair ascent/descent with reciprocal stepping pattern and unilateral upper extremity support mimicking entering/existing home (3 steps to enter/exit) with no LOB/knee buckling    Baseline IE: Difficulty with negotiating step-up onto carport and stairs to enter/exit home.   09/01/20: deferred. 10/04/20: Performed as described above with no LOB    Time 8    Period Weeks    Status Achieved    Target Date 09/29/20                   Plan - 10/06/20 6712     Clinical Impression Statement Patient has made excellent progress with PT and reports minimal symptoms at this time in low back and her knees. She has met FOTO goal and all impairment-based and functional goals with only exception being MMT goal. Discussed with patient continued strengthening with IND HEP, and patient is comfortable with moving forward independently. Pt to  transition to advanced HEP and  transition to independent program following her next visit. Pt will benefit from continued HEP to address remaining strength deficits and maintain QoL. Plan to discharge the current case following today's visit.    Personal Factors and Comorbidities Age;Comorbidity 3+;Time since onset of injury/illness/exacerbation;Other   Patient lives alone   Examination-Activity Limitations Bed Mobility;Transfers;Sit;Locomotion Level;Stairs    Examination-Participation Restrictions Community Activity;Interpersonal Relationship;Shop    Stability/Clinical Decision Making Evolving/Moderate complexity    Rehab Potential Good    PT Frequency 2x / week    PT Duration 8 weeks    PT Treatment/Interventions Therapeutic activities;Therapeutic exercise;Neuromuscular re-education;Patient/family education;Manual techniques;Passive range of motion;Gait training    PT Next Visit Plan Transition to independent home program following today's visit. Discharge.    PT Home Exercise Plan HEP provided - Access Code [email or web address]    Consulted and Agree with Plan of Care Patient             Patient will benefit from skilled therapeutic intervention in order to improve the following deficits and impairments:  Hypomobility, Decreased strength, Decreased range of motion, Difficulty walking, Pain  Visit Diagnosis: Chronic pain of left knee  Chronic pain of right knee  Muscle weakness (generalized)  Difficulty in walking, not elsewhere classified     Problem List There are no problems to display for this patient.  Valentina Gu, PT, DPT #[id number]  Eilleen Kempf 10/06/2020, 8:48 AM  [facility] [address] [place], [place], [address] Phone: [phone]   Fax:  [phone]  Name: Natasha Walker MRN: [id number] Date of Birth: 23-Apr-1940

## 2021-09-20 ENCOUNTER — Encounter (INDEPENDENT_AMBULATORY_CARE_PROVIDER_SITE_OTHER): Payer: Self-pay | Admitting: Nurse Practitioner

## 2021-09-20 ENCOUNTER — Ambulatory Visit (INDEPENDENT_AMBULATORY_CARE_PROVIDER_SITE_OTHER): Payer: Medicare Other | Admitting: Nurse Practitioner

## 2021-09-20 VITALS — BP 149/70 | HR 62 | Resp 16 | Wt 166.0 lb

## 2021-09-20 DIAGNOSIS — I739 Peripheral vascular disease, unspecified: Secondary | ICD-10-CM

## 2021-09-20 DIAGNOSIS — M5430 Sciatica, unspecified side: Secondary | ICD-10-CM | POA: Diagnosis not present

## 2021-10-04 ENCOUNTER — Encounter (INDEPENDENT_AMBULATORY_CARE_PROVIDER_SITE_OTHER): Payer: Self-pay | Admitting: Nurse Practitioner

## 2021-10-04 NOTE — Progress Notes (Signed)
Subjective:    Patient ID: Natasha Walker, female    DOB: [birth year], 81 y.o.   MRN: [id number] Chief Complaint  Patient presents with   New Patient (Initial Visit)    Ref [place] consult asymptomatic varicose veins    Patient is seen for evaluation of leg pain and leg swelling. The patient first noticed the swelling remotely. The swelling is associated with pain and discoloration. The pain and swelling worsens with prolonged dependency and improves with elevation. The pain is unrelated to activity.  The patient notes that in the morning the legs are significantly improved but they steadily worsened throughout the course of the day. The patient also notes a steady worsening of the discoloration in the ankle and shin area.   The patient denies claudication symptoms.  The patient denies symptoms consistent with rest pain.  The patient denies and extensive history of DJD and LS spine disease.  The patient has no had any past angiography, interventions or vascular surgery.  Elevation makes the leg symptoms better, dependency makes them much worse. There is no history of ulcerations. The patient denies any recent changes in medications.  The patient has not been wearing graduated compression.  The patient denies a history of DVT or PE. There is no prior history of phlebitis. There is no history of primary lymphedema.  No history of malignancies. No history of trauma or groin or pelvic surgery. There is no history of radiation treatment to the groin or pelvis  The patient denies amaurosis fugax or recent TIA symptoms. There are no recent neurological changes noted. The patient denies recent episodes of angina or shortness of breath     Review of Systems  Cardiovascular:  Positive for leg swelling.  All other systems reviewed and are negative.      Objective:   Physical Exam Vitals reviewed.  HENT:     Head: Normocephalic.  Cardiovascular:     Rate and Rhythm: Normal rate.      Pulses:          Dorsalis pedis pulses are 1+ on the right side and 1+ on the left side.       Posterior tibial pulses are 0 on the right side and 0 on the left side.  Pulmonary:     Effort: Pulmonary effort is normal.  Skin:    General: Skin is warm and dry.  Neurological:     Mental Status: She is alert and oriented to person, place, and time.  Psychiatric:        Mood and Affect: Mood normal.        Behavior: Behavior normal.        Thought Content: Thought content normal.        Judgment: Judgment normal.     BP (!) 149/70 (BP Location: Right Arm)   Pulse 62   Resp 16   Wt 166 lb (75.3 kg)   BMI 32.42 kg/m   Past Medical History:  Diagnosis Date   COPD (chronic obstructive pulmonary disease) ([facility])    Hypercholesteremia    Hypertension    TIA (transient ischemic attack)    several yrs ago. No deficits.   Vertigo    none recently   Wears dentures    partial upper    Social History   Socioeconomic History   Marital status: Widowed    Spouse name: Not on file   Number of children: Not on file   Years of education: Not on file  Highest education level: Not on file  Occupational History   Not on file  Tobacco Use   Smoking status: Former   Smokeless tobacco: Never   Tobacco comments:    Socially as teenager  Vaping Use   Vaping Use: Never used  Substance and Sexual Activity   Alcohol use: No   Drug use: No   Sexual activity: Not on file  Other Topics Concern   Not on file  Social History Narrative   Not on file   Social Determinants of Health   Financial Resource Strain: Not on file  Food Insecurity: Not on file  Transportation Needs: Not on file  Physical Activity: Not on file  Stress: Not on file  Social Connections: Not on file  Intimate Partner Violence: Not on file    Past Surgical History:  Procedure Laterality Date   ABDOMINAL HYSTERECTOMY     BACK SURGERY     CATARACT EXTRACTION W/PHACO Left 10/20/2019   Procedure: CATARACT  EXTRACTION PHACO AND INTRAOCULAR LENS PLACEMENT (IOC) LEFT ISTENT INJ 7.13  00:42.2;  Surgeon: [place] Crane, MD;  Location[facility];  Service: Ophthalmology;  Laterality: Left;   CATARACT EXTRACTION W/PHACO Right 11/10/2019   Procedure: CATARACT EXTRACTION PHACO AND INTRAOCULAR LENS PLACEMENT (IOC) RIGHT;  Surgeon: Nevada Crane, MD;  [facility];  Service: Ophthalmology;  Laterality: Right;  5.52 0:44.0   TONSILLECTOMY     TUBAL LIGATION      Family History  Problem Relation Age of Onset   Hypertension Mother    Hypertension Father    Cancer Sister    Diabetes Sister    Heart attack Brother     Allergies  Allergen Reactions   Other     Other reaction(s): Other (See Comments), Other (See Comments) Intolerance for wool, cigarette smoke and dust Intolerance for wool, cigarette smoke and dust    Gabapentin Anxiety    Other reaction(s): Other (See Comments), Other (See Comments) Made me "out of it" Made me "out of it" Made me "out of it"    Pollen Extract Itching   Tape Rash       Latest Ref Rng & Units 07/21/2011    1:33 PM 04/03/2011   12:36 PM  CBC  WBC 3.6 - 11.0 x10 3/mm 3 7.6  6.9   Hemoglobin 12.0 - 16.0 g/dL 16.1  09.6   Hematocrit 35.0 - 47.0 % 40.4  39.7   Platelets 150 - 440 x10 3/mm 3 179  203       CMP     Component Value Date/Time   NA 143 07/21/2011 1333   K 3.5 07/21/2011 1333   CL 105 07/21/2011 1333   CO2 29 07/21/2011 1333   GLUCOSE 86 07/21/2011 1333   BUN 24 (H) 07/21/2011 1333   CREATININE 1.15 07/21/2011 1333   CALCIUM 9.0 07/21/2011 1333   PROT 7.3 07/21/2011 1333   ALBUMIN 3.6 07/21/2011 1333   AST 28 07/21/2011 1333   ALT 44 07/21/2011 1333   ALKPHOS 112 07/21/2011 1333   BILITOT 0.4 07/21/2011 1333   GFRNONAA 48 (L) 07/21/2011 1333   GFRAA 56 (L) 07/21/2011 1333     No results found.     Assessment & Plan:   1. Peripheral vascular disease ([facility]CC) Recommend:  I have had a long  discussion with the patient regarding swelling and why it  causes symptoms.  Patient will begin wearing graduated compression on a daily basis a prescription was given.  The patient will  wear the stockings first thing in the morning and removing them in the evening. The patient is instructed specifically not to sleep in the stockings.   In addition, behavioral modification will be initiated.  This will include frequent elevation, use of over the counter pain medications and exercise such as walking.  Consideration for a lymph pump will also be made based upon the effectiveness of conservative therapy.  This would help to improve the edema control and prevent sequela such as ulcers and infections   Patient should undergo duplex ultrasound of the venous system to ensure that DVT or reflux is not present.  The patient also has some pain with ambulation.  Based on this we will also have the patient undergo ABIs as well.  The patient will follow-up with me after the ultrasound.    2. Sciatica, unspecified laterality They also be a cause for patient's lower extremity pain.   Current Outpatient Medications on File Prior to Visit  Medication Sig Dispense Refill   albuterol (VENTOLIN HFA) 108 (90 Base) MCG/ACT inhaler Inhale 1-2 puffs into the lungs every 4 (four) hours as needed for wheezing or shortness of breath. 1 each 1   Ascorbic Acid (VITAMIN C) 1000 MG tablet Take 1,000 mg by mouth daily.     aspirin 81 MG tablet Take 81 mg by mouth daily.     Biotin 5000 MCG TABS Take by mouth daily.     calcium-vitamin D (OSCAL WITH D) 500-200 MG-UNIT tablet Take 1 tablet by mouth daily with breakfast.     carvedilol (COREG) 3.125 MG tablet Take 3.125 mg by mouth 2 (two) times daily with a meal.     DORZOLAMIDE HCL-TIMOLOL MAL OP Apply to eye in the morning and at bedtime.     felodipine (PLENDIL) 2.5 MG 24 hr tablet Take 10 mg by mouth daily.      fluticasone (FLONASE) 50 MCG/ACT nasal spray Place 2 sprays  into both nostrils daily.     hydrochlorothiazide (HYDRODIURIL) 12.5 MG tablet Take 12.5 mg by mouth daily.     ibuprofen (ADVIL) 400 MG tablet Take 400 mg by mouth every 6 (six) hours as needed.     losartan (COZAAR) 50 MG tablet Take 50 mg by mouth daily.     Magnesium 400 MG TABS Take by mouth daily.     meclizine (ANTIVERT) 25 MG tablet Take 25 mg by mouth 3 (three) times daily as needed for dizziness.      metroNIDAZOLE (METROGEL) 1 % gel Apply topically daily as needed.     montelukast (SINGULAIR) 10 MG tablet Take 10 mg by mouth daily as needed.     PREMARIN vaginal cream Place vaginally.     telmisartan (MICARDIS) 80 MG tablet Take by mouth.     tiZANidine (ZANAFLEX) 2 MG tablet TAKE 1 TABLET BY MOUTH AT BEDTIME AS NEEDED FOR MUSCLE SPASM     Travoprost, BAK Free, (TRAVATAN) 0.004 % SOLN ophthalmic solution 1 drop at bedtime.     VITAMIN A PO Take by mouth daily.     vitamin B-12 (CYANOCOBALAMIN) 1000 MCG tablet Take 1,000 mcg by mouth daily.     VITAMIN D PO Take by mouth daily.     vitamin E 180 MG (400 UNITS) capsule Take 400 Units by mouth daily.     azithromycin (ZITHROMAX) 250 MG tablet Take 1 tablet (250 mg total) by mouth daily. Take first 2 tablets together, then 1 every day until finished. (  Patient not taking: Reported on 09/20/2021) 6 tablet 0   No current facility-administered medications on file prior to visit.    There are no Patient Instructions on file for this visit. No follow-ups on file.   Georgiana Spinner, NP

## 2021-10-25 ENCOUNTER — Other Ambulatory Visit (INDEPENDENT_AMBULATORY_CARE_PROVIDER_SITE_OTHER): Payer: Self-pay | Admitting: Nurse Practitioner

## 2021-10-25 DIAGNOSIS — I739 Peripheral vascular disease, unspecified: Secondary | ICD-10-CM

## 2021-10-25 DIAGNOSIS — M7989 Other specified soft tissue disorders: Secondary | ICD-10-CM

## 2021-10-26 ENCOUNTER — Ambulatory Visit (INDEPENDENT_AMBULATORY_CARE_PROVIDER_SITE_OTHER): Payer: Medicare Other

## 2021-10-26 ENCOUNTER — Ambulatory Visit (INDEPENDENT_AMBULATORY_CARE_PROVIDER_SITE_OTHER): Payer: Medicare Other | Admitting: Nurse Practitioner

## 2021-10-26 ENCOUNTER — Encounter (INDEPENDENT_AMBULATORY_CARE_PROVIDER_SITE_OTHER): Payer: Self-pay | Admitting: Nurse Practitioner

## 2021-10-26 VITALS — BP 152/87 | HR 78 | Resp 16 | Wt 165.2 lb

## 2021-10-26 DIAGNOSIS — I872 Venous insufficiency (chronic) (peripheral): Secondary | ICD-10-CM | POA: Diagnosis not present

## 2021-10-26 DIAGNOSIS — E782 Mixed hyperlipidemia: Secondary | ICD-10-CM

## 2021-10-26 DIAGNOSIS — I890 Lymphedema, not elsewhere classified: Secondary | ICD-10-CM

## 2021-10-26 DIAGNOSIS — I739 Peripheral vascular disease, unspecified: Secondary | ICD-10-CM

## 2021-10-26 DIAGNOSIS — M7989 Other specified soft tissue disorders: Secondary | ICD-10-CM

## 2021-10-26 NOTE — Progress Notes (Signed)
Subjective:    Patient ID: Natasha Walker, female    DOB: [birth year], 81 y.o.   MRN: [id number] Chief Complaint  Patient presents with   Follow-up    Ultrasound follow up    The patient returns to the office for followup evaluation regarding leg swelling.  The swelling has persisted and the pain associated with swelling continues. There have not been any interval development of a ulcerations or wounds.  Since the previous visit the patient has been wearing graduated compression stockings and has noted little if any improvement in the lymphedema. The patient has not been using compression routinely morning until night.  Today noninvasive studies show no evidence of DVT or superficial thrombophlebitis bilaterally.  No evidence of superficial venous reflux bilaterally.  There is evidence of deep venous insufficiency bilaterally.  The patient has right ABI 1.21 on the left and 1.17.  Patient has primarily triphasic waveforms in the bilateral tibial arteries with good toe waveforms bilaterally.      Review of Systems  Cardiovascular:  Positive for leg swelling.  Musculoskeletal:  Positive for gait problem.  All other systems reviewed and are negative.      Objective:   Physical Exam Vitals reviewed.  HENT:     Head: Normocephalic.  Cardiovascular:     Rate and Rhythm: Normal rate.  Pulmonary:     Effort: Pulmonary effort is normal.  Musculoskeletal:     Right lower leg: Edema present.     Left lower leg: Edema present.  Skin:    General: Skin is warm and dry.  Neurological:     Mental Status: She is alert and oriented to person, place, and time.  Psychiatric:        Mood and Affect: Mood normal.        Behavior: Behavior normal.        Thought Content: Thought content normal.        Judgment: Judgment normal.     BP (!) 152/87 (BP Location: Left Arm)   Pulse 78   Resp 16   Wt 165 lb 3.2 oz (74.9 kg)   BMI 32.26 kg/m   Past Medical History:  Diagnosis Date    COPD (chronic obstructive pulmonary disease) (HCC)    Hypercholesteremia    Hypertension    TIA (transient ischemic attack)    several yrs ago. No deficits.   Vertigo    none recently   Wears dentures    partial upper    Social History   Socioeconomic History   Marital status: Widowed    Spouse name: Not on file   Number of children: Not on file   Years of education: Not on file   Highest education level: Not on file  Occupational History   Not on file  Tobacco Use   Smoking status: Former   Smokeless tobacco: Never   Tobacco comments:    Socially as teenager  Vaping Use   Vaping Use: Never used  Substance and Sexual Activity   Alcohol use: No   Drug use: No   Sexual activity: Not on file  Other Topics Concern   Not on file  Social History Narrative   Not on file   Social Determinants of Health   Financial Resource Strain: Not on file  Food Insecurity: Not on file  Transportation Needs: Not on file  Physical Activity: Not on file  Stress: Not on file  Social Connections: Not on file  Intimate Partner Violence: Not on  file    Past Surgical History:  Procedure Laterality Date   ABDOMINAL HYSTERECTOMY     BACK SURGERY     CATARACT EXTRACTION W/PHACO Left 10/20/2019   Procedure: CATARACT EXTRACTION PHACO AND INTRAOCULAR LENS PLACEMENT (IOC) LEFT ISTENT INJ 7.13  00:42.2;  Surgeon: [place] Crane, MD;  Location: [facility];  Service: Ophthalmology;  Laterality: Left;   CATARACT EXTRACTION W/PHACO Right 11/10/2019   Procedure: CATARACT EXTRACTION PHACO AND INTRAOCULAR LENS PLACEMENT (IOC) RIGHT;  Surgeon: Nevada Crane, MD;  Location: [facility];  Service: Ophthalmology;  Laterality: Right;  5.52 0:44.0   TONSILLECTOMY     TUBAL LIGATION      Family History  Problem Relation Age of Onset   Hypertension Mother    Hypertension Father    Cancer Sister    Diabetes Sister    Heart attack Brother     Allergies  Allergen Reactions    Other     Other reaction(s): Other (See Comments), Other (See Comments) Intolerance for wool, cigarette smoke and dust Intolerance for wool, cigarette smoke and dust    Bee Pollen Itching   Gabapentin Anxiety    Other reaction(s): Other (See Comments), Other (See Comments) Made me "out of it" Made me "out of it" Made me "out of it"    Pollen Extract Itching   Tape Rash       Latest Ref Rng & Units 07/21/2011    1:33 PM 04/03/2011   12:36 PM  CBC  WBC 3.6 - 11.0 x10 3/mm 3 7.6  6.9   Hemoglobin 12.0 - 16.0 g/dL 35.5  73.2   Hematocrit 35.0 - 47.0 % 40.4  39.7   Platelets 150 - 440 x10 3/mm 3 179  203       CMP     Component Value Date/Time   NA 143 07/21/2011 1333   K 3.5 07/21/2011 1333   CL 105 07/21/2011 1333   CO2 29 07/21/2011 1333   GLUCOSE 86 07/21/2011 1333   BUN 24 (H) 07/21/2011 1333   CREATININE 1.15 07/21/2011 1333   CALCIUM 9.0 07/21/2011 1333   PROT 7.3 07/21/2011 1333   ALBUMIN 3.6 07/21/2011 1333   AST 28 07/21/2011 1333   ALT 44 07/21/2011 1333   ALKPHOS 112 07/21/2011 1333   BILITOT 0.4 07/21/2011 1333   GFRNONAA 48 (L) 07/21/2011 1333   GFRAA 56 (L) 07/21/2011 1333     No results found.     Assessment & Plan:   1. Lymphedema With a long discussion about lymphedema.  We discussed that lymphedema is a chronic condition that has to be managed.  There is currently no cure for lymphedema.  Typically there is no medication to treat lymphedema.  We discussed that the utilization of medical grade compression therapy.  Patient should elevate her lower extremities.  She is advised to walk if possible but this may be difficult for her with her sciatica.  We will have the patient return in 3 months to determine problems with conservative therapy for possible use of a lymphedema pump.  2. Venous insufficiency of both lower extremities She will continue with conservative therapy as noted above.  3. Mixed hyperlipidemia Continue statin as ordered and  reviewed, no changes at this time    Current Outpatient Medications on File Prior to Visit  Medication Sig Dispense Refill   Ascorbic Acid (VITAMIN C) 1000 MG tablet Take 1,000 mg by mouth daily.     aspirin 81 MG tablet  Take 81 mg by mouth daily.     Biotin 5000 MCG TABS Take by mouth daily.     calcium-vitamin D (OSCAL WITH D) 500-200 MG-UNIT tablet Take 1 tablet by mouth daily with breakfast.     carvedilol (COREG) 3.125 MG tablet Take 3.125 mg by mouth 2 (two) times daily with a meal.     DORZOLAMIDE HCL-TIMOLOL MAL OP Apply to eye in the morning and at bedtime.     felodipine (PLENDIL) 2.5 MG 24 hr tablet Take 10 mg by mouth daily.      fluticasone (FLONASE) 50 MCG/ACT nasal spray Place 2 sprays into both nostrils daily.     hydrochlorothiazide (HYDRODIURIL) 12.5 MG tablet Take 12.5 mg by mouth daily.     ibuprofen (ADVIL) 400 MG tablet Take 400 mg by mouth every 6 (six) hours as needed.     losartan (COZAAR) 50 MG tablet Take 50 mg by mouth daily.     Magnesium 400 MG TABS Take by mouth daily.     meclizine (ANTIVERT) 25 MG tablet Take 25 mg by mouth 3 (three) times daily as needed for dizziness.      metroNIDAZOLE (METROGEL) 1 % gel Apply topically daily as needed.     montelukast (SINGULAIR) 10 MG tablet Take 10 mg by mouth daily as needed.     pravastatin (PRAVACHOL) 40 MG tablet Take 40 mg by mouth daily.     PREMARIN vaginal cream Place vaginally.     tiZANidine (ZANAFLEX) 2 MG tablet TAKE 1 TABLET BY MOUTH AT BEDTIME AS NEEDED FOR MUSCLE SPASM     Travoprost, BAK Free, (TRAVATAN) 0.004 % SOLN ophthalmic solution 1 drop at bedtime.     VITAMIN A PO Take by mouth daily.     vitamin B-12 (CYANOCOBALAMIN) 1000 MCG tablet Take 1,000 mcg by mouth daily.     VITAMIN D PO Take by mouth daily.     vitamin E 180 MG (400 UNITS) capsule Take 400 Units by mouth daily.     albuterol (VENTOLIN HFA) 108 (90 Base) MCG/ACT inhaler Inhale 1-2 puffs into the lungs every 4 (four) hours as needed  for wheezing or shortness of breath. 1 each 1   azithromycin (ZITHROMAX) 250 MG tablet Take 1 tablet (250 mg total) by mouth daily. Take first 2 tablets together, then 1 every day until finished. (Patient not taking: Reported on 09/20/2021) 6 tablet 0   telmisartan (MICARDIS) 80 MG tablet Take by mouth.     No current facility-administered medications on file prior to visit.    There are no Patient Instructions on file for this visit. No follow-ups on file.   Kris Hartmann, NP

## 2022-01-20 ENCOUNTER — Ambulatory Visit (INDEPENDENT_AMBULATORY_CARE_PROVIDER_SITE_OTHER): Payer: Medicare Other | Admitting: Vascular Surgery

## 2022-04-11 ENCOUNTER — Ambulatory Visit (INDEPENDENT_AMBULATORY_CARE_PROVIDER_SITE_OTHER): Payer: 59

## 2022-04-11 ENCOUNTER — Ambulatory Visit
Admission: EM | Admit: 2022-04-11 | Discharge: 2022-04-11 | Disposition: A | Discharge status: Home / Self Care | Payer: 59 | Attending: Physician Assistant | Admitting: Physician Assistant

## 2022-04-11 ENCOUNTER — Ambulatory Visit (INDEPENDENT_AMBULATORY_CARE_PROVIDER_SITE_OTHER)
Admit: 2022-04-11 | Discharge: 2022-04-11 | Disposition: A | Discharge status: Home / Self Care | Payer: 59 | Attending: Physician Assistant | Admitting: Physician Assistant

## 2022-04-11 DIAGNOSIS — M25562 Pain in left knee: Secondary | ICD-10-CM

## 2022-04-11 DIAGNOSIS — I82412 Acute embolism and thrombosis of left femoral vein: Secondary | ICD-10-CM

## 2022-04-11 DIAGNOSIS — M79605 Pain in left leg: Secondary | ICD-10-CM

## 2022-04-11 NOTE — ED Provider Notes (Signed)
[facility]    CSN: [id number] Arrival date & time: 04/11/22  1147      History   Chief Complaint Chief Complaint  Patient presents with   Knee Pain    HPI [name] is a 82 y.o. female.   Patient presents today with a 3-day history of significant left knee and lower leg pain.  She reports pain is rated 10 on a 0-10 pain scale and is preventing her from sleeping at night.  She denies any known injury or increase in activity prior to symptom onset.  Denies any recent falls or trauma.  Reports pain began suddenly and has been ongoing since that time.  She has been keeping it elevated and using ice as well as topical antiinflammatory medication without improvement of symptoms.  She denies any significant leg swelling, heart racing, chest pain, shortness of breath.  Denies history of VTE event.  She denies any recent immobilization, hospitalization, exogenous hormone use (only Premarin vaginal estrogen), history of malignancy, recent travel, recent COVID-19 infection.  She does have a history of osteoarthritis and often has flares in this knee.  She denies previous injury or surgery involving her knee.    Past Medical History:  Diagnosis Date   COPD (chronic obstructive pulmonary disease) (HCC)    Hypercholesteremia    Hypertension    TIA (transient ischemic attack)    several yrs ago. No deficits.   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   Moderate mitral insufficiency 08/18/2014   Benign essential hypertension 06/22/2014   Lumbar stenosis 06/12/2014   Peripheral vascular disease ([place]) 10/22/2013   Sciatica 10/17/2012   Acute on chronic diastolic heart failure ([place]) 08/08/2010   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0   TONSILLECTOMY     TUBAL LIGATION      OB History   No obstetric history on file.      Home Medications    Prior to Admission medications   Medication Sig Start Date End Date Taking? Authorizing Provider  albuterol (VENTOLIN HFA) 108 (90 Base) MCG/ACT inhaler Inhale 1-2 puffs into the lungs every 4 (four) hours as needed for wheezing or shortness of breath. 02/19/20 04/11/22 Yes Danton Clap, PA-C  Ascorbic Acid (VITAMIN C) 1000 MG tablet Take 1,000 mg  by mouth daily.   Yes [provider]  aspirin 81 MG tablet Take 81 mg by mouth daily.   Yes [provider]  azithromycin (ZITHROMAX) 250 MG tablet Take 1 tablet (250 mg total) by mouth daily. Take first 2 tablets together, then 1 every day until finished. 02/19/20  Yes Danton Clap, PA-C  Biotin 5000 MCG TABS Take by mouth daily.   Yes [provider]  calcium-vitamin D (OSCAL WITH D) 500-200 MG-UNIT tablet Take 1 tablet by mouth  daily with breakfast.   Yes [provider]  carvedilol (COREG) 3.125 MG tablet Take 3.125 mg by mouth 2 (two) times daily with a meal.   Yes [provider]  DORZOLAMIDE HCL-TIMOLOL MAL OP Apply to eye in the morning and at bedtime.   Yes [provider]  felodipine (PLENDIL) 2.5 MG 24 hr tablet Take 10 mg by mouth daily.    Yes [provider]  fluticasone (FLONASE) 50 MCG/ACT nasal spray Place 2 sprays into both nostrils daily.   Yes [provider]  hydrochlorothiazide (HYDRODIURIL) 12.5 MG tablet Take 12.5 mg by mouth daily. 12/22/19  Yes [provider]  ibuprofen (ADVIL) 400 MG tablet Take 400 mg by mouth every 6 (six) hours as needed.   Yes [provider]  losartan (COZAAR) 50 MG tablet Take 50 mg by mouth daily.   Yes [provider]  Magnesium 400 MG TABS Take by mouth daily.   Yes [provider]  meclizine (ANTIVERT) 25 MG tablet Take 25 mg by mouth 3 (three) times daily as needed for dizziness.    Yes [provider]  metroNIDAZOLE (METROGEL) 1 % gel Apply topically daily as needed.   Yes [provider]  montelukast (SINGULAIR) 10 MG tablet Take 10 mg by mouth daily as needed.   Yes [provider]  pravastatin (PRAVACHOL) 40 MG tablet Take 40 mg by mouth daily. 10/14/21  Yes [provider]  PREMARIN vaginal cream Place vaginally. 01/26/20  Yes [provider]  telmisartan (MICARDIS) 80 MG tablet Take by mouth. 01/26/20 04/11/22 Yes [provider]  tiZANidine (ZANAFLEX) 2 MG tablet TAKE 1 TABLET BY MOUTH AT BEDTIME AS NEEDED FOR MUSCLE SPASM 01/24/19  Yes [provider]  Travoprost, BAK Free, (TRAVATAN) 0.004 % SOLN ophthalmic solution 1 drop at bedtime.   Yes [provider]  VITAMIN A PO Take by mouth daily.   Yes [provider]  vitamin B-12 (CYANOCOBALAMIN) 1000 MCG tablet Take 1,000 mcg by mouth daily.   Yes [provider]  VITAMIN D PO Take by mouth daily.   Yes [provider]  vitamin E 180 MG (400 UNITS) capsule Take 400 Units by mouth daily.   Yes [provider]    Family History Family History  Problem Relation Age of Onset   Hypertension Mother    Hypertension Father    Cancer Sister    Diabetes Sister    Heart attack Brother     Social History Social History   Tobacco Use   Smoking status: Former   Smokeless tobacco: Never   Tobacco comments:    Socially as teenager  Scientific laboratory technician Use: Never used  Substance Use Topics   Alcohol use: No   Drug use: No     Allergies   Other, Bee pollen, Gabapentin, Pollen extract, and Tape   Review of Systems Review of Systems  Constitutional:  Positive for activity change.  Negative for appetite change, fatigue and fever.  Respiratory:  Negative for shortness of breath.   Cardiovascular:  Negative for chest pain, palpitations and leg swelling.  Gastrointestinal:  Negative for abdominal pain, diarrhea, nausea and vomiting.  Musculoskeletal:  Positive for arthralgias, gait problem, joint swelling and myalgias.  Skin:  Negative for color change and wound.  Neurological:  Negative for weakness.     Physical Exam Triage Vital Signs ED Triage Vitals  Enc Vitals Group     BP 04/11/22 1217 (!) 144/84     Pulse Rate 04/11/22 1217 66     Resp 04/11/22 1217 16     Temp 04/11/22 1217 98.3 F (36.8 C)     Temp Source 04/11/22 1217 Oral     SpO2 04/11/22 1217 94 %     Weight 04/11/22 1215 160 lb (72.6 kg)     Height 04/11/22 1215 5' (1.524 m)     Head Circumference --      Peak Flow --      Pain Score 04/11/22 1215 10     Pain Loc --      Pain Edu? --      Excl. in [place]? --    No data found.  Updated Vital Signs BP (!) 144/84 (BP Location: Left Arm)   Pulse 66   Temp 98.3 F (36.8 C) (Oral)   Resp 16   Ht 5' (1.524 m)   Wt 160 lb (72.6 kg)   SpO2 94%   BMI 31.25 kg/m   Visual Acuity Right  Eye Distance:   Left Eye Distance:   Bilateral Distance:    Right Eye Near:   Left Eye Near:    Bilateral Near:     Physical Exam Vitals reviewed.  Constitutional:      General: She is awake. She is not in acute distress.    Appearance: Normal appearance. She is well-developed. She is not ill-appearing.     Comments: Very pleasant female appears stated age in no acute distress sitting comfortably in wheelchair in exam room  HENT:     Head: Normocephalic and atraumatic.  Cardiovascular:     Rate and Rhythm: Normal rate and regular rhythm.     Heart sounds: S1 normal and S2 normal. Murmur heard.     Comments: Tenderness throughout left lower leg with positive Homans' sign Pulmonary:     Effort: Pulmonary effort is normal.     Breath sounds: Normal breath sounds. No wheezing, rhonchi or rales.     Comments: Clear to auscultation bilaterally Musculoskeletal:     Left knee: Swelling present. No effusion. Decreased range of motion. Tenderness present over the medial joint line and lateral joint line. No LCL laxity, MCL laxity, ACL laxity or PCL laxity.    Right lower leg: No edema.     Left lower leg: No edema.     Comments: Tender palpation over inferior joint line.  Mild swelling with minimal effusion noted.  No deformity or step-off.  No ligamentous laxity.  Significant decrease of motion with flexion secondary to pain.  No palpable cord in popliteal fossa.  Psychiatric:        Behavior: Behavior is cooperative.      UC Treatments / Results  Labs (all labs ordered are listed, but only abnormal results are displayed) Labs Reviewed - No data to display  EKG   Radiology US Venous Img Lower Unilateral Left  Result Date: 04/11/2022 CLINICAL DATA:  Pain and swelling of the  left leg particularly in the knee region. EXAM: Left LOWER EXTREMITY VENOUS DOPPLER ULTRASOUND TECHNIQUE: Gray-scale sonography with graded compression, as well as color Doppler and duplex ultrasound were  performed to evaluate the lower extremity deep venous systems from the level of the common femoral vein and including the common femoral, femoral, profunda femoral, popliteal and calf veins including the posterior tibial, peroneal and gastrocnemius veins when visible. The superficial great saphenous vein was also interrogated. Spectral Doppler was utilized to evaluate flow at rest and with distal augmentation maneuvers in the common femoral, femoral and popliteal veins. COMPARISON:  None Available. FINDINGS: Contralateral Common Femoral Vein: Respiratory phasicity is normal and symmetric with the symptomatic side. No evidence of thrombus. Normal compressibility. Common Femoral Vein: No evidence of thrombus. Normal compressibility, respiratory phasicity and response to augmentation. Saphenofemoral Junction: No evidence of thrombus. Normal compressibility and flow on color Doppler imaging. Profunda Femoral Vein: No evidence of thrombus. Normal compressibility and flow on color Doppler imaging. Femoral Vein: Positive for deep venous thrombosis, occlusive. Popliteal Vein: No evidence of thrombus. Normal compressibility, respiratory phasicity and response to augmentation. Calf Veins: No evidence of thrombus. Normal compressibility and flow on color Doppler imaging. Superficial Great Saphenous Vein: No evidence of thrombus. Normal compressibility. Venous Reflux:  None. Other Findings:  Baker cyst incidentally noted. IMPRESSION: Study is positive for occlusive deep venous thrombosis in the left femoral vein. Electronically Signed   By: Nelson Chimes M.D.   On: 04/11/2022 13:56   DG Knee Complete 4 Views Left  Result Date: 04/11/2022 CLINICAL DATA:  Left knee pain and swelling EXAM: LEFT KNEE - COMPLETE 4+ VIEW COMPARISON:  None Available. FINDINGS: No acute fracture. Tricompartmental osteoarthritis, severe in the medial compartment where there is bone-on-bone apposition. Chondrocalcinosis is seen. Small joint effusion.  No focal soft tissue swelling. IMPRESSION: 1. Tricompartmental osteoarthritis, severe in the medial compartment. 2. Small joint effusion. Electronically Signed   By: Davina Poke D.O.   On: 04/11/2022 13:27    Procedures Procedures (including critical care time)  Medications Ordered in UC Medications - No data to display  Initial Impression / Assessment and Plan / UC Course  I have reviewed the triage vital signs and the nursing notes.  Pertinent labs & imaging results that were available during my care of the patient were reviewed by me and considered in my medical decision making (see chart for details).     Patient is well-appearing, afebrile, nontoxic, nontachycardic.  X-ray was obtained which showed significant osteoarthritis.  DVT ultrasound was ordered given pain throughout her leg with calf tenderness which did show positive DVT in femoral vein.  Given anatomical location above the knee we discussed safe thing to do because the emergency room.  Patient is agreeable to this and will go directly to ER for further evaluation and management.  She was stable to time of discharge.  Called and discussed case with Dr. Mannie Stabile who agreed with treatment plan.  Final Clinical Impressions(s) / UC Diagnoses   Final diagnoses:  Acute deep vein thrombosis (DVT) of femoral vein of left lower extremity (HCC)  Acute pain of left knee  Left leg pain     Discharge Instructions      You have a blood clot in your upper left leg.  Please go to the emergency room for further evaluation and management as we discussed.     ED Prescriptions   None    PDMP not reviewed this encounter.   Terrilee Croak, PA-C 04/11/22  1422  

## 2022-04-11 NOTE — ED Triage Notes (Signed)
Pt c/o L knee pain x3 days, denies any falls or injury. Pain radiates down leg at times.

## 2022-04-11 NOTE — Discharge Instructions (Signed)
You have a blood clot in your upper left leg.  Please go to the emergency room for further evaluation and management as we discussed.

## 2022-10-04 ENCOUNTER — Ambulatory Visit
Admission: EM | Admit: 2022-10-04 | Discharge: 2022-10-04 | Disposition: A | Discharge status: Home / Self Care | Payer: 59 | Attending: Family Medicine | Admitting: Family Medicine

## 2022-10-04 DIAGNOSIS — L299 Pruritus, unspecified: Secondary | ICD-10-CM

## 2022-10-04 MED ORDER — ALUM AMMONIUM (BULK) POWD: Freq: Two times a day (BID) | 0 refills | Status: AC | PRN

## 2022-10-04 MED ORDER — HYDROXYZINE HCL 10 MG PO TABS: 10.0000 mg | ORAL_TABLET | Freq: Two times a day (BID) | ORAL | Status: DC | PRN

## 2022-10-04 MED ORDER — AMMONIUM LACTATE 12 % EX LOTN: TOPICAL_LOTION | CUTANEOUS | Status: DC | PRN

## 2022-10-04 MED ORDER — HYDROXYZINE HCL 25 MG PO TABS: 25.0000 mg | ORAL_TABLET | Freq: Four times a day (QID) | ORAL | 0 refills | Status: AC

## 2022-10-04 NOTE — ED Provider Notes (Signed)
[facility]    CSN: [id number] Arrival date & time: 10/04/22  0906      History   Chief Complaint Chief Complaint  Patient presents with   Pruritis    HPI Natasha Walker is a 82 y.o. female.   Patient reports today with increased itching x 2 weeks.  She has had history of itching but is normally controlled with Benadryl.  Benadryl taken at home along with extra hydrating lotion.  she denies changing any body washes or any new medicines.     Past Medical History:  Diagnosis Date   COPD (chronic obstructive pulmonary disease) (HCC)    Hypercholesteremia    Hypertension    TIA (transient ischemic attack)    several yrs ago. No deficits.   Vertigo    none recently   Wears dentures    partial upper    Patient Active Problem List   Diagnosis Date Noted   Primary osteoarthritis of left knee 06/27/2020   Dilated cardiomyopathy (HCC) 07/18/2018   Chronic pain of left knee 12/04/2017   Venous insufficiency of both lower extremities 12/04/2017   Asthma 11/16/2017   Glaucoma 11/16/2017   Mixed hyperlipidemia 11/16/2017   Bradycardia 05/17/2017   S/P lumbar fusion 12/01/2016   Mild aortic valve stenosis 08/30/2016   Prolapse of anterior vaginal wall 12/02/2015   Bilateral carotid artery stenosis 04/13/2015   Spondylolisthesis of lumbar region 10/28/2014   Moderate mitral insufficiency 08/18/2014   Benign essential hypertension 06/22/2014   Lumbar stenosis 06/12/2014   Peripheral vascular disease (HCC) 10/22/2013   Sciatica 10/17/2012   Acute on chronic diastolic heart failure (HCC) 08/08/2010   Cerebral artery occlusion with cerebral infarction (HCC) 08/06/2010    Past Surgical History:  Procedure Laterality Date   ABDOMINAL HYSTERECTOMY     BACK SURGERY     CATARACT EXTRACTION W/PHACO Left 10/20/2019   Procedure: CATARACT EXTRACTION PHACO AND INTRAOCULAR LENS PLACEMENT (IOC) LEFT ISTENT INJ 7.13  00:42.2;  Surgeon: Nevada Crane, MD;  Location:  [facility];  Service: Ophthalmology;  Laterality: Left;   CATARACT EXTRACTION W/PHACO Right 11/10/2019   Procedure: CATARACT EXTRACTION PHACO AND INTRAOCULAR LENS PLACEMENT (IOC) RIGHT;  Surgeon: Nevada Crane, MD;  Location: [facility];  Service: Ophthalmology;  Laterality: Right;  5.52 0:44.0   TONSILLECTOMY     TUBAL LIGATION      OB History   No obstetric history on file.      Home Medications    Prior to Admission medications   Medication Sig Start Date End Date Taking? Authorizing Provider  albuterol (VENTOLIN HFA) 108 (90 Base) MCG/ACT inhaler Inhale 1-2 puffs into the lungs every 4 (four) hours as needed for wheezing or shortness of breath. 02/19/20 10/04/22 Yes Eusebio Friendly B, PA-C  alum, ammonium (ALUM) powder Apply topically 2 (two) times daily as needed. 10/04/22  Yes Fabianna Keats, Linde Gillis, NP  Ascorbic Acid (VITAMIN C) 1000 MG tablet Take 1,000 mg by mouth daily.   Yes [provider]  aspirin 81 MG tablet Take 81 mg by mouth daily.   Yes [provider]  azithromycin (ZITHROMAX) 250 MG tablet Take 1 tablet (250 mg total) by mouth daily. Take first 2 tablets together, then 1 every day until finished. 02/19/20  Yes Eusebio Friendly B, PA-C  beta carotene 40981 UNIT capsule 1 capsule DAILY (route: oral) 02/06/22  Yes [provider]  Biotin 5000 MCG TABS Take by mouth daily.   Yes [provider]  calcium-vitamin D (OSCAL WITH D) 500-200 MG-UNIT tablet Take 1 tablet by mouth daily with breakfast.   Yes [provider]  carvedilol (COREG) 3.125 MG tablet Take 3.125 mg by mouth 2 (two) times daily with a meal.   Yes [provider]  chlorthalidone (HYGROTON) 25 MG tablet Take 1 tablet by mouth daily. 02/13/20  Yes [provider]  DORZOLAMIDE HCL-TIMOLOL MAL OP Apply to eye in the morning and at bedtime.   Yes [provider]  ezetimibe (ZETIA) 10 MG tablet Take 1 tablet by mouth daily.   Yes  [provider]  famotidine (PEPCID) 20 MG tablet  06/15/22  Yes [provider]  felodipine (PLENDIL) 2.5 MG 24 hr tablet Take 10 mg by mouth daily.    Yes [provider]  fluticasone (FLONASE) 50 MCG/ACT nasal spray Place 2 sprays into both nostrils daily.   Yes [provider]  hydrochlorothiazide (HYDRODIURIL) 12.5 MG tablet Take 12.5 mg by mouth daily. 12/22/19  Yes [provider]  hydrOXYzine (ATARAX) 25 MG tablet Take 1 tablet (25 mg total) by mouth every 6 (six) hours. 10/04/22  Yes Kristi Norment, Linde Gillis, NP  ibuprofen (ADVIL) 400 MG tablet Take 400 mg by mouth every 6 (six) hours as needed.   Yes [provider]  losartan (COZAAR) 50 MG tablet Take 50 mg by mouth daily.   Yes [provider]  Magnesium 400 MG TABS Take by mouth daily.   Yes [provider]  meclizine (ANTIVERT) 25 MG tablet Take 25 mg by mouth 3 (three) times daily as needed for dizziness.    Yes [provider]  metroNIDAZOLE (METROGEL) 1 % gel Apply topically daily as needed.   Yes [provider]  montelukast (SINGULAIR) 10 MG tablet Take 10 mg by mouth daily as needed.   Yes [provider]  pravastatin (PRAVACHOL) 40 MG tablet Take 40 mg by mouth daily. 10/14/21  Yes [provider]  PREMARIN vaginal cream Place vaginally. 01/26/20  Yes [provider]  spironolactone (ALDACTONE) 25 MG tablet Take by mouth. 09/18/22 09/18/23 Yes [provider]  telmisartan (MICARDIS) 80 MG tablet Take by mouth. 01/26/20 10/04/22 Yes [provider]  tiZANidine (ZANAFLEX) 2 MG tablet TAKE 1 TABLET BY MOUTH AT BEDTIME AS NEEDED FOR MUSCLE SPASM 01/24/19  Yes [provider]  Travoprost, BAK Free, (TRAVATAN) 0.004 % SOLN ophthalmic solution 1 drop at bedtime.   Yes [provider]  VITAMIN A PO Take by mouth daily.   Yes [provider]  vitamin B-12 (CYANOCOBALAMIN) 1000 MCG tablet Take  1,000 mcg by mouth daily.   Yes [provider]  VITAMIN D PO Take by mouth daily.   Yes [provider]  vitamin E 180 MG (400 UNITS) capsule Take 400 Units by mouth daily.   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   Respiratory: Negative.    Genitourinary: Negative.   Skin: Negative.      Physical Exam Triage Vital Signs ED Triage Vitals  Encounter Vitals Group     BP 10/04/22 0926 (!) 148/77     Systolic BP Percentile --      Diastolic BP Percentile --      Pulse Rate 10/04/22 0926 78     Resp 10/04/22 0926 16     Temp 10/04/22 0926 98.5 F (36.9 C)     Temp Source 10/04/22 0926 Oral     SpO2 10/04/22 0926 96 %     Weight 10/04/22 0924 162 lb (73.5 kg)     Height 10/04/22 0924 5' (1.524 m)     Head Circumference --      Peak Flow --      Pain Score 10/04/22 0933 0     Pain Loc --      Pain Education --      Exclude from Growth Chart --    No data found.  Updated Vital Signs BP (!) 148/77 (BP Location: Right Arm)   Pulse 78   Temp 98.5 F (36.9 C) (Oral)   Resp 16   Ht 5' (1.524 m)   Wt 162 lb (73.5 kg)   SpO2 96%   BMI 31.64 kg/m   Visual Acuity Right Eye Distance:   Left Eye Distance:   Bilateral Distance:    Right Eye Near:   Left Eye Near:    Bilateral Near:     Physical Exam Constitutional:      Appearance: Normal appearance.  HENT:      Nose: Nose normal.  Skin:    General: Skin is warm and moist.  Neurological:     Mental Status: She is alert.      UC Treatments / Results  Labs (all labs ordered are listed, but only abnormal results are displayed) Labs Reviewed - No data to display  EKG   Radiology No results found.  Procedures Procedures (including critical care time)  Medications Ordered in UC Medications  hydrOXYzine (ATARAX) tablet 10 mg (has no administration in time range)  ammonium lactate (LAC-HYDRIN) 12 % lotion (has no administration in time range)    Initial Impression / Assessment and Plan / UC Course  I have reviewed the triage vital signs and the nursing notes.  Pertinent labs & imaging results that were available during my care of the patient were reviewed by me and considered in my medical decision making (see chart for details).   Patient examined.  She has history of pruritus.  Patient is currently taking cetirizine, Benadryl, famotidine routinely.  None of these medications are helping with the itching.  Skin exam is completely negative.  We will try hydroxyzine and recommend follow-up with PCP.  Patient educated on increased fall risk.   Final Clinical Impressions(s) / UC Diagnoses   Final diagnoses:  Pruritus     Discharge Instructions      Use hydroxyzine as needed for itching.  Medication may cause drowsiness and increase fall risk.   Follow-up with PCP As scheduled.     ED Prescriptions     Medication Sig Dispense Auth. Provider   alum, ammonium (ALUM) powder Apply topically 2 (two) times daily as needed. 360 g Lailanie Hasley M, NP   hydrOXYzine (ATARAX) 25 MG tablet Take 1 tablet (25 mg total) by  mouth every 6 (six) hours. 12 tablet Josearmando Kuhnert, Linde Gillis, NP      PDMP not reviewed this encounter.   Nelda Marseille, NP 10/04/22 1025

## 2022-10-04 NOTE — Discharge Instructions (Addendum)
Use hydroxyzine as needed for itching.  Medication may cause drowsiness and increase fall risk.   Follow-up with PCP As scheduled.

## 2022-10-04 NOTE — ED Triage Notes (Signed)
Pt c/o itchiness all over body x2 wks. Has tried benadryl w/o relief. States was recently started on spironolactone by cardio & itching started after.

## 2022-10-30 ENCOUNTER — Ambulatory Visit
Admission: EM | Admit: 2022-10-30 | Discharge: 2022-10-30 | Discharge status: Against Medical Advice | Payer: 59 | Attending: Emergency Medicine | Admitting: Emergency Medicine

## 2022-10-30 ENCOUNTER — Other Ambulatory Visit: Payer: Self-pay

## 2022-10-30 HISTORY — DX: Acquired absence of other specified parts of digestive tract: Z90.49

## 2022-10-30 NOTE — ED Triage Notes (Signed)
Pressure in nose and eyes with dizziness x now was seen by PMD and was suggested to go to ENT. Pt also c/o rectal bleeding x 4 days pt is filling toilet with blood. Pt states when she stands she gets dizzy. Admits to hx of hemorrhoids.

## 2022-10-30 NOTE — ED Notes (Signed)
Pt LWBS after triage. States she no longer wanted to wait & proceeded to ER for further eval.

## 2023-10-17 ENCOUNTER — Ambulatory Visit (INDEPENDENT_AMBULATORY_CARE_PROVIDER_SITE_OTHER)

## 2023-10-17 ENCOUNTER — Ambulatory Visit
Admission: EM | Admit: 2023-10-17 | Discharge: 2023-10-17 | Disposition: A | Discharge status: Home / Self Care | Attending: Emergency Medicine | Admitting: Emergency Medicine

## 2023-10-17 DIAGNOSIS — M1712 Unilateral primary osteoarthritis, left knee: Secondary | ICD-10-CM

## 2023-10-17 DIAGNOSIS — M25562 Pain in left knee: Secondary | ICD-10-CM

## 2023-10-17 MED ORDER — ACETAMINOPHEN 325 MG PO TABS
975.0000 mg | ORAL_TABLET | Freq: Once | ORAL | Status: AC
  Administered 2023-10-17: 975 mg via ORAL

## 2023-10-17 MED ORDER — METHYLPREDNISOLONE 4 MG PO TBPK: ORAL_TABLET | ORAL | 0 refills | Status: AC

## 2023-10-17 MED ORDER — DEXAMETHASONE SODIUM PHOSPHATE 10 MG/ML IJ SOLN
10.0000 mg | Freq: Once | INTRAMUSCULAR | Status: AC
  Administered 2023-10-17: 10 mg via INTRAMUSCULAR

## 2023-10-17 NOTE — ED Triage Notes (Addendum)
 Patient states that her left knee has been swollen and hurting since Sunday. Patient staets that she hasn't injured her knee.  Patient states that she has been taking tylenol . Last dose last night.

## 2023-10-17 NOTE — Discharge Instructions (Addendum)
 As we discussed, your x-ray shows that you have arthritis in your left knee and this is most likely causing the pain and swelling.  I have made a referral to [facility] clinic orthopedics for you.  They will contact you to make an appointment.  We have given you an injection of steroids in clinic today which will help decrease swelling and aid in pain relief.  Please start the Medrol  Dosepak tomorrow morning.  Take it according to the package instructions.  Keep your left knee elevated is much as possible to help decrease swelling and aid in pain relief.  Wearing compression hose can also be helpful and reducing the swelling in your lower extremity and providing pain relief.  If you develop any new or worsening symptoms please return for reevaluation or follow-up with your primary care provider.

## 2023-10-17 NOTE — ED Provider Notes (Addendum)
 [facility]    CSN: [id number] Arrival date & time: 10/17/23  9187      History   Chief Complaint Chief Complaint  Patient presents with   Knee Pain    HPI Natasha Walker is a 83 y.o. female.   HPI  83 year old female with past medical history significant for COPD, hypertension, high cholesterol, TIA, and vertigo presents for evaluation of pain and swelling to her left knee.  She denies any known injury.  She reports that she has had fluid removed from her knee previously.  She is unsure about arthritis.  She has been using Tylenol  for the pain with minimal improvement.  Past Medical History:  Diagnosis Date   COPD (chronic obstructive pulmonary disease) (HCC)    History of cholecystectomy    Hypercholesteremia    Hypertension    TIA (transient ischemic attack)    several yrs ago. No deficits.   Vertigo    none recently   Wears dentures    partial upper    Patient Active Problem List   Diagnosis Date Noted   Primary osteoarthritis of left knee 06/27/2020   Dilated cardiomyopathy (HCC) 07/18/2018   Chronic pain of left knee 12/04/2017   Venous insufficiency of both lower extremities 12/04/2017   Asthma 11/16/2017   Glaucoma 11/16/2017   Mixed hyperlipidemia 11/16/2017   Bradycardia 05/17/2017   S/P lumbar fusion 12/01/2016   Mild aortic valve stenosis 08/30/2016   Prolapse of anterior vaginal wall 12/02/2015   Bilateral carotid artery stenosis 04/13/2015   Spondylolisthesis of lumbar region 10/28/2014   Moderate mitral insufficiency 08/18/2014   Benign essential hypertension 06/22/2014   Lumbar stenosis 06/12/2014   Peripheral vascular disease (HCC) 10/22/2013   Sciatica 10/17/2012   Acute on chronic diastolic heart failure (HCC) 08/08/2010   Cerebral artery occlusion with cerebral infarction (HCC) 08/06/2010    Past Surgical History:  Procedure Laterality Date   ABDOMINAL HYSTERECTOMY     BACK SURGERY     CATARACT EXTRACTION W/PHACO Left  10/20/2019   Procedure: CATARACT EXTRACTION PHACO AND INTRAOCULAR LENS PLACEMENT (IOC) LEFT ISTENT INJ 7.13  00:42.2;  Surgeon: Myrna Adine Anes, MD;  Location: [facility];  Service: Ophthalmology;  Laterality: Left;   CATARACT EXTRACTION W/PHACO Right 11/10/2019   Procedure: CATARACT EXTRACTION PHACO AND INTRAOCULAR LENS PLACEMENT (IOC) RIGHT;  Surgeon: Myrna Adine Anes, MD;  Location: [facility];  Service: Ophthalmology;  Laterality: Right;  5.52 0:44.0   TONSILLECTOMY     TUBAL LIGATION      OB History   No obstetric history on file.      Home Medications    Prior to Admission medications   Medication Sig Start Date End Date Taking? Authorizing Provider  Ascorbic Acid (VITAMIN C) 1000 MG tablet Take 1,000 mg by mouth daily.   Yes [provider]  aspirin 81 MG tablet Take 81 mg by mouth daily.   Yes [provider]  carvedilol (COREG) 3.125 MG tablet Take 3.125 mg by mouth 2 (two) times daily with a meal.   Yes [provider]  chlorthalidone (HYGROTON) 25 MG tablet Take 1 tablet by mouth daily. 02/13/20  Yes [provider]  felodipine (PLENDIL) 2.5 MG 24 hr tablet Take 10 mg by mouth daily.    Yes [provider]  hydrochlorothiazide (HYDRODIURIL) 12.5 MG tablet Take 12.5 mg by mouth daily. 12/22/19  Yes [provider]  losartan (COZAAR) 50 MG tablet Take 50 mg by mouth daily.  Yes [provider]  methylPREDNISolone  (MEDROL  DOSEPAK) 4 MG TBPK tablet Take according to the package insert. 10/17/23  Yes Bernardino Ditch, NP  albuterol  (VENTOLIN  HFA) 108 (90 Base) MCG/ACT inhaler Inhale 1-2 puffs into the lungs every 4 (four) hours as needed for wheezing or shortness of breath. 02/19/20 10/30/22  Arvis Huxley B, PA-C  alum, ammonium (ALUM) powder Apply topically 2 (two) times daily as needed. 10/04/22   Blitch, Marval HERO, NP  azithromycin  (ZITHROMAX ) 250 MG tablet Take 1 tablet (250 mg total) by mouth daily.  Take first 2 tablets together, then 1 every day until finished. 02/19/20   Arvis Huxley B, PA-C  beta carotene 25000 UNIT capsule 1 capsule DAILY (route: oral) 02/06/22   [provider]  Biotin 5000 MCG TABS Take by mouth daily.    [provider]  calcium-vitamin D (OSCAL WITH D) 500-200 MG-UNIT tablet Take 1 tablet by mouth daily with breakfast.    [provider]  DORZOLAMIDE HCL-TIMOLOL MAL OP Apply to eye in the morning and at bedtime.    [provider]  ezetimibe (ZETIA) 10 MG tablet Take 1 tablet by mouth daily.    [provider]  famotidine (PEPCID) 20 MG tablet  06/15/22   [provider]  fluticasone (FLONASE) 50 MCG/ACT nasal spray Place 2 sprays into both nostrils daily.    [provider]  hydrOXYzine  (ATARAX ) 25 MG tablet Take 1 tablet (25 mg total) by mouth every 6 (six) hours. 10/04/22   Blitch, Marval HERO, NP  ibuprofen (ADVIL) 400 MG tablet Take 400 mg by mouth every 6 (six) hours as needed.    [provider]  Magnesium 400 MG TABS Take by mouth daily.    [provider]  meclizine (ANTIVERT) 25 MG tablet Take 25 mg by mouth 3 (three) times daily as needed for dizziness.     [provider]  metroNIDAZOLE (METROGEL) 1 % gel Apply topically daily as needed.    [provider]  montelukast (SINGULAIR) 10 MG tablet Take 10 mg by mouth daily as needed.    [provider]  pravastatin (PRAVACHOL) 40 MG tablet Take 40 mg by mouth daily. 10/14/21   [provider]  PREMARIN vaginal cream Place vaginally. 01/26/20   [provider]  spironolactone (ALDACTONE) 25 MG tablet Take by mouth. 09/18/22 09/18/23  [provider]  telmisartan (MICARDIS) 80 MG tablet Take by mouth. 01/26/20 10/04/22  [provider]  tiZANidine (ZANAFLEX) 2 MG tablet TAKE 1 TABLET BY MOUTH AT BEDTIME AS NEEDED FOR MUSCLE SPASM 01/24/19   [provider]  Travoprost, BAK  Free, (TRAVATAN) 0.004 % SOLN ophthalmic solution 1 drop at bedtime.    [provider]  VITAMIN A PO Take by mouth daily.    [provider]  vitamin B-12 (CYANOCOBALAMIN) 1000 MCG tablet Take 1,000 mcg by mouth daily.    [provider]  VITAMIN D PO Take by mouth daily.    [provider]  vitamin E 180 MG (400 UNITS) capsule Take 400 Units by mouth daily.    [provider]    Family History Family History  Problem Relation Age of Onset   Hypertension Mother    Hypertension Father    Cancer Sister    Diabetes Sister    Heart attack Brother     Social History Social History   Tobacco Use   Smoking status: Former   Smokeless tobacco: Never   Tobacco comments:  Socially as teenager  Vaping Use   Vaping status: Never Used  Substance Use Topics   Alcohol use: No   Drug use: No     Allergies   Atorvastatin, Dust mite extract, Lanolin, Other, Pravastatin, Bee pollen, Gabapentin, Pollen extract, Spironolactone, and Tape   Review of Systems Review of Systems  Musculoskeletal:  Positive for arthralgias and joint swelling.  Skin:  Negative for color change.     Physical Exam Triage Vital Signs ED Triage Vitals  Encounter Vitals Group     BP      Girls Systolic BP Percentile      Girls Diastolic BP Percentile      Boys Systolic BP Percentile      Boys Diastolic BP Percentile      Pulse      Resp      Temp      Temp src      SpO2      Weight      Height      Head Circumference      Peak Flow      Pain Score      Pain Loc      Pain Education      Exclude from Growth Chart    No data found.  Updated Vital Signs BP 102/65 (BP Location: Left Arm)   Pulse 73   Temp 98.7 F (37.1 C) (Oral)   Resp 16   Wt 165 lb (74.8 kg)   SpO2 94%   BMI 32.22 kg/m   Visual Acuity Right Eye Distance:   Left Eye Distance:   Bilateral Distance:    Right Eye Near:   Left Eye Near:    Bilateral Near:     Physical  Exam Vitals and nursing note reviewed.  Constitutional:      Appearance: Normal appearance. She is not ill-appearing.  HENT:     Head: Normocephalic and atraumatic.  Musculoskeletal:        General: Swelling and tenderness present. No signs of injury.  Skin:    General: Skin is warm and dry.     Capillary Refill: Capillary refill takes less than 2 seconds.     Findings: No bruising or erythema.  Neurological:     General: No focal deficit present.     Mental Status: She is alert and oriented to person, place, and time.      UC Treatments / Results  Labs (all labs ordered are listed, but only abnormal results are displayed) Labs Reviewed - No data to display  EKG   Radiology No results found.  Procedures Procedures (including critical care time)  Medications Ordered in UC Medications  acetaminophen  (TYLENOL ) tablet 975 mg (975 mg Oral Given 10/17/23 0904)    Initial Impression / Assessment and Plan / UC Course  I have reviewed the triage vital signs and the nursing notes.  Pertinent labs & imaging results that were available during my care of the patient were reviewed by me and considered in my medical decision making (see chart for details).   Patient is a pleasant, nontoxic-appearing 83 year old female presenting for evaluation of pain and swelling to the left knee that began 3 days ago.  No known injury.  On exam the knee does appear mildly swollen.  She is tender over the patella, tibial tuberosity, medial and lateral joint line, and quadriceps complex.  No tenderness with palpation of the popliteal space.  The patient reports increased pain when I get her  to full extension and she has mild pain with varus and valgus stress application.  No laxity to suggest collateral ligament sprain.  I will obtain a radiograph of the left knee to evaluate for arthropathy and effusion.  I will also order 975 mg of Tylenol  for patient for her pain.  Review of PDMP shows an accidental  overdose score of 000.  She has no open controlled substance prescription at this time.  Left knee x-rays independently reviewed and evaluated by me.  Impression: Degenerative changes throughout the knee and most prominent in the medial compartment.  No visible fracture or effusion noted.  Radiology read is pending. Radiology impression states compartmental degenerative joint disease.  Small suprapatellar joint effusion.  No fracture or dislocation.  I will discharge patient on the diagnosis of osteoarthritis of her left knee.  I will have staff administer 10 mg of IM Decadron  here in clinic and discharge her home on a low-dose steroid pack to start tomorrow morning.  Given her poor cardiovascular history I feel safer giving her low-dose steroids versus giving her nonsteroidals at this time.  The patient was seen at [facility] in August 2023 for similar complaints and was prescribed naproxen at that time.  Additionally, I will refer her to orthopedics to discuss ongoing management of her knee pain.   Final Clinical Impressions(s) / UC Diagnoses   Final diagnoses:  Acute pain of left knee  Primary osteoarthritis of left knee     Discharge Instructions      As we discussed, your x-ray shows that you have arthritis in your left knee and this is most likely causing the pain and swelling.  I have made a referral to [facility] clinic orthopedics for you.  They will contact you to make an appointment.  We have given you an injection of steroids in clinic today which will help decrease swelling and aid in pain relief.  Please start the Medrol  Dosepak tomorrow morning.  Take it according to the package instructions.  Keep your left knee elevated is much as possible to help decrease swelling and aid in pain relief.  Wearing compression hose can also be helpful and reducing the swelling in your lower extremity and providing pain relief.  If you develop any new or worsening symptoms please return for  reevaluation or follow-up with your primary care provider.     ED Prescriptions     Medication Sig Dispense Auth. Provider   methylPREDNISolone  (MEDROL  DOSEPAK) 4 MG TBPK tablet Take according to the package insert. 1 each Bernardino Ditch, NP      I have reviewed the PDMP during this encounter.   Bernardino Ditch, NP 10/17/23 0935    Bernardino Ditch, NP 10/17/23 [id number]

## 2023-11-19 NOTE — Progress Notes (Signed)
 Chief Complaint Chief Complaint  Patient presents with  . Left Knee - Pain  . Right Knee - Pain    Reason for Visit The patient is an 83 year old female that presented for reevaluation of bilateral knee pain with the left knee much worse than the right.  She was given bilateral knee injections several weeks ago and is doing much better.  She has a little bit of soreness in the shin bone on the left leg more than the right, but is walking better.  She does have a walking stick.  She has some physical therapy ordered for her lower back.  She went to the urgent care for [facility] health on October 17, 2023 and was given a steroid injection and prednisone , although not in her left knee.  She then went on October 27, 2023 to [facility] emergency room and was treated for possible gout.  She did have some lab work.  She was given Percocet and colchicine, which made her nauseous and almost blacked out.  She stopped this medication.  She is only using Advil or ibuprofen now.  She has had cortisone injections in the past that did well.  She does not take any anti-inflammatories besides occasional ibuprofen and Tylenol .  She has stiffness with walking and soreness with trying to get up from seated position.  She is limping some.  She has not had any falls or injuries.  There is some swelling in the left knee more than the right.  She was given Mobic 7.5 mg once a day.  The patient is not a diabetic.   Medications Current Outpatient Medications  Medication Sig Dispense Refill  . acetaminophen  (TYLENOL ) 325 MG tablet Take 650 mg by mouth every 6 (six) hours as needed for Pain.    . albuterol  90 mcg/actuation inhaler Inhale 2 inhalations into the lungs every 4 (four) hours as needed for Wheezing.    SABRA ascorbic acid, vitamin C, (VITAMIN C) 1000 MG tablet Take 1,000 mg by mouth once daily    . aspirin 81 MG EC tablet Take 81 mg by mouth once daily.    . beta carotene 25000 UNIT capsule 1 capsule DAILY (route: oral)    .  biotin 5 mg Tab Take 1 tablet by mouth once daily    . brimonidine-timoloL (COMBIGAN) 0.2-0.5 % ophthalmic solution Place 1 drop into both eyes 2 (two) times daily    . calcium carbonate-vitamin D3 (OS-CAL 500+D) 500 mg(1,250mg ) -200 unit tablet Take 1 tablet by mouth once daily.      . cetirizine (ZYRTEC) 5 MG tablet     . chlorthalidone 25 MG tablet Take 1 tablet (25 mg total) by mouth once daily 90 tablet 3  . conjugated estrogens (PREMARIN) 0.625 mg/gram vaginal cream Frequency:PHARMDIR   Dosage:0.0     Instructions:  Note:Apply a small, finger-tip ammount, three times a week as needed. Dose: 0.625MG /G    . cyanocobalamin (VITAMIN B12) 1000 MCG tablet Take 1,000 mcg by mouth once daily.      . diclofenac (VOLTAREN) 1 % topical gel Apply 2 g topically as needed    . ezetimibe (ZETIA) 10 mg tablet Take 1 tablet by mouth once daily    . felodipine (PLENDIL) 10 MG ER tablet TAKE 1 TABLET BY MOUTH ONCE  DAILY 100 tablet 2  . fluticasone (FLONASE) 50 mcg/actuation nasal spray Place 2 sprays into both nostrils once daily.    SABRA KLOR-CON M10 10 mEq ER tablet Take 10 mEq by mouth  once daily    . LASIX 20 mg tablet Take 20 mg by mouth once daily    . losartan (COZAAR) 50 MG tablet Take 0.5 tablets (25 mg total) by mouth once daily 45 tablet 3  . magnesium 30 mg tablet Take 30 mg by mouth once daily      . meclizine (ANTIVERT) 25 mg tablet Take 1 tablet (25 mg total) by mouth 3 (three) times daily as needed for Dizziness. 30 tablet 0  . meloxicam (MOBIC) 7.5 MG tablet Take 1 tablet (7.5 mg total) by mouth once daily for 30 days 30 tablet 1  . metroNIDAZOLE (METROGEL) 1 % gel Apply topically once daily.      . montelukast (SINGULAIR) 10 mg tablet Take 10 mg by mouth once daily.      SABRA tiZANidine (ZANAFLEX) 4 MG tablet Take 4 mg by mouth at bedtime    . travoprost (TRAVATAN Z) 0.004 % Ophth ophthalmic solution Place 1 drop into both eyes at bedtime    . vitamin E 1000 UNIT capsule Take 1,000 Units by  mouth once daily.       No current facility-administered medications for this visit.    Allergies Atorvastatin; Gabapentin; Other; Pravastatin; Spironolactone; Adhesive; Adhesive tape-silicones; and Pollen,fermented  Histories Past Medical History: Past Medical History:  Diagnosis Date  . Allergy   . Arthritis   . Asthma without status asthmaticus (HHS-HCC)   . Brain tumor (CMS/HHS-HCC) 03/01/2019  . CHF (congestive heart failure) (CMS/HHS-HCC)   . Depression   . GERD (gastroesophageal reflux disease)   . Heart disease   . High cholesterol   . Hyperlipidemia   . Hypertension   . PVD (peripheral vascular disease)    with tia  . TIA (transient ischemic attack)   . VHD (valvular heart disease)     Past Surgical History: Past Surgical History:  Procedure Laterality Date  . LAMINECTOMY POSTERIOR LUMBAR FACETECTOMY & FORAMINOTOMY W/DECOMP N/A 12/07/2014   Procedure: Airo #1, LAMINECTOMY POSTERIOR LUMBAR FACETECTOMY & FORAMINOTOMY W/DECOMP   L2-S1 laminectomy/fusion with L5-S1 Tlif depuy expedium, brainlab, airo, cell saver, aquamantys, ;  Surgeon: Eleanor CHRISTELLA Delton, MD;  Location: [facility];  Service: Orthopedics;  Laterality: N/A;  . APPLICATION VERTERBRAL DEFECT PROSTHETIC DEVICE N/A 12/07/2014   Procedure: APPLICATION VERTERBRAL DEFECT PROSTHETIC DEVICE;  Surgeon: Eleanor CHRISTELLA Delton, MD;  Location: [facility];  Service: Orthopedics;  Laterality: N/A;  . ARTHRODESIS POSTERIOR LUMBAR SPINE W/LAMINECTOMY/DISCECTOMY N/A 12/07/2014   Procedure: ARTHRODESIS INTERBODY TECHNIQUE POSTERIOR LUMBAR SPINE W/LAMINECTOMY/DISCECTOMY;  Surgeon: Eleanor CHRISTELLA Delton, MD;  Location: [facility];  Service: Orthopedics;  Laterality: N/A;  . AUTOGRAFT OBTAINED SAME INCISION FOR SPINE SURGERY N/A 12/07/2014   Procedure: AUTOGRAFT OBTAINED SAME INCISION FOR SPINE SURGERY;  Surgeon: Eleanor CHRISTELLA Delton, MD;  Location: [facility];  Service: Orthopedics;  Laterality: N/A;   . INSERTION MORSELIZED BONE ALLOGRAFT FOR SPINE SURGERY N/A 12/07/2014   Procedure: INSERTION MORSELIZED BONE ALLOGRAFT FOR SPINE SURGERY;  Surgeon: Eleanor CHRISTELLA Delton, MD;  Location: [facility];  Service: Orthopedics;  Laterality: N/A;  . PERCUTANEOUS SPINAL FUSION N/A 12/07/2014   Procedure: PERCUTANEOUS SPINE FUSION LUMBAR POSTERIOR;  Surgeon: Eleanor CHRISTELLA Delton, MD;  Location: [facility];  Service: Orthopedics;  Laterality: N/A;  . ASPIRATION BONE MARROW N/A 12/07/2014   Procedure: ASPIRATION BONE MARROW;  Surgeon: Eleanor CHRISTELLA Delton, MD;  Location: [facility];  Service: Orthopedics;  Laterality: N/A;  . INSTRUMENTATION POSTERIOR SPINE 3 TO 6 VERTEBRAL SEGMENTS N/A 12/07/2014  Procedure: INSTRUMENTATION POSTERIOR SPINE 3 TO 6 VERTEBRAL SEGMENTS;  Surgeon: Eleanor CHRISTELLA Delton, MD;  Location: [facility];  Service: Orthopedics;  Laterality: N/A;  . FUSION POSTERIOR SPINAL DEFORMITY UP TO 6 VERTEBRAL SEGMENTS N/A 12/07/2014   Procedure: FUSION POSTERIOR SPINAL DEFORMITY UP TO 6 VERTEBRAL SEGMENTS;  Surgeon: Eleanor CHRISTELLA Delton, MD;  Location: [facility];  Service: Orthopedics;  Laterality: N/A;  . INSTRUMENTATION SPINE W/PELVIC FIXATION OTHER THAN SACRUM N/A 12/07/2014   Procedure: INSTRUMENTATION SPINE W/PELVIC FIXATION OTHER THAN SACRUM;  Surgeon: Eleanor CHRISTELLA Delton, MD;  Location: [facility];  Service: Orthopedics;  Laterality: N/A;  . ABDOMINAL HYSTERECTOMY  ~1998   WITH UNILATERAL SO  . BACK SURGERY    . CHOLECYSTECTOMY  ~1990s  . COLONOSCOPY    . fatty tissue procedure    . HYSTERECTOMY    . TONSILLECTOMY  ~1950s  . TREATMENT FISTULA ANAL  ~1970s  . TUBAL LIGATION  ~1970s    Social History: Social History   Socioeconomic History  . Marital status: Widowed  Occupational History  . Occupation: retired    Comment: since age 23  Tobacco Use  . Smoking status: Never  . Smokeless tobacco: Never  Vaping Use  . Vaping status:  Never Used  Substance and Sexual Activity  . Alcohol use: No  . Drug use: No  . 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 Difficulty of Paying Living Expenses: Not hard at all  Food Insecurity: No Food Insecurity (04/12/2022)   Received from [facility]   Hunger Vital Sign   . Within the past 12 months, you worried that your food would run out before you got the money to buy more.: Never true   . Within the past 12 months, the food you bought just didn't last and you didn't have money to get more.: Never true  Transportation Needs: No Transportation Needs (04/12/2022)   Received from [facility] - Transportation   . Lack of Transportation (Medical): No   . Lack of Transportation (Non-Medical): No  Housing Stability: Unknown (09/18/2023)   Housing Stability Vital Sign   . Homeless in the Last Year: No    Family History: Family History  Problem Relation Name Age of Onset  . High blood pressure (Hypertension) Mother    . Dementia Mother         alzheimer's at age 53  . Reflux disease Mother    . Lupus Mother    . High blood pressure (Hypertension) Father    . Hyperlipidemia (Elevated cholesterol) Father    . Prostate cancer Father    . Reflux disease Father    . Sleep apnea Father    . Kidney disease Father    . Heart disease Father    . Diabetes type II Sister Heron   . Cancer Sister Avelina   . Anesthesia problems Neg Hx      Review of Systems A comprehensive 14 point ROS was performed, reviewed, and the pertinent orthopaedic findings are documented in the HPI.  Exam BP 124/76   Ht 152.4 cm (5')   Wt 75.8 kg (167 lb)   BMI 32.61 kg/m    General/Constitutional: NAD, conversant Eyes: Pupils  equal and round, extraocular  movements intact ENT: atraumatic external nose and ears, moist mucous membranes Respiratory: non-labored breathing, symmetric chest rise, chest sounds clear. Cardiovascular: no visible lower extremity edema, peripheral pulses present, regular rate and rhythm  Skin: normal skin turgor, warm and dry Neurological: cranial nerves grossly intact, sensation grossly intact Psychological:  Appropriate mood and affect; appropriate judgment Musculoskeletal: as detailed below:  General: Well developed, well nourished 83 y.o. female in no apparent distress.  Normal affect.  Normal communication.  Patient answers questions appropriately.  The patient has a slow shuffling gait.  There is left-sided antalgic component.  There is no hip lurch.    Bilateral lower Extremities: Examination of the bilateral lower extremity reveals no bony abnormality, no edema, no effusion and no ecchymosis.  There is no valgus or varus abnormality.  The patient is bilaterally-tender along the lateral joint line, and is bilaterally-tender along the medial joint line.  The patient has full knee flexion and extension on the right with 95 degrees flexion and -5 degrees extension on the left. There is bilateral discomfort with range of motion exercises.  The patient has a negative rotational Mcmurray test.  There is bilateral retropatellar discomfort.  The patient has a negative patella stretch test.  The patient has a negative varus stress test and a negative valgus stress test, in looking for stability.  The patient has a negative Lachman's test.  Vascular: The patient has a negative Toula' test bilaterally.  The patient had a normal dorsalis pedis and posterior tibial pulse.  There is normal skin warmth.  There is normal capillary refill bilaterally.    Neurologic: The patient has a negative straight leg raise.  The patient has normal muscle strength testing for the quadriceps, calves, ankle dorsiflexion, ankle plantarflexion, and  extensor hallicus longus.  The patient has sensation that is intact to light touch.  The deep tendon reflexes are normal at the patella and achilles.  No clonus is noted.    Radiology: Xrays of the right knee were ordered and interpreted 11/19/2023, with 4 views using AP, PA flexed view, lateral, and patella views.  Xrays revealed the right knee with medial compartment complete collapse with bone-on-bone pathology and sclerotic changes with osteophyte formation throughout.  The left knee shows similar findings with medial compartment collapse.  There is no fracture or dislocation and no stress fracture.  The patella shows severe patellofemoral osteophyte formation with degenerative narrowing.    Impression Encounter Diagnoses  Name Primary?  . Primary osteoarthritis of right knee Yes  . Primary osteoarthritis of left knee   . Adult BMI 32.0-32.9 kg/sq m      Plan   1.  I discussed the physical exam finding as well as x-ray results and previous treatment. 2.  We did discuss other injections, but we will hold off today. 3.  We discussed her lower back and the physical therapy they are currently forming.  She may need an MRI of the lumbar spine. 4.  She was continued on Mobic 7.5 mg once a day for inflammation control. 5.  She will discontinue her Percocet and colchicine because of blacking out and nausea. 6.  She will continue Voltaren gel if needed. 7.  She will follow-up in 2 weeks for evaluation of both knees.  We may need an MRI of the left knee if no improvement after the injection.     This note was generated in part with voice recognition software and I apologize for any typographical  errors that were not detected and corrected.  Review of the [place]  CSRS was performed in accordance with the [place] MVE prior to dispensing any controlled substances.   Dorn Krystal Doyne PA-C, [email or web address]

## 2023-11-21 ENCOUNTER — Ambulatory Visit: Attending: Orthopedic Surgery

## 2023-11-21 DIAGNOSIS — M25662 Stiffness of left knee, not elsewhere classified: Secondary | ICD-10-CM | POA: Diagnosis present

## 2023-11-21 DIAGNOSIS — M6281 Muscle weakness (generalized): Secondary | ICD-10-CM | POA: Insufficient documentation

## 2023-11-21 DIAGNOSIS — M5459 Other low back pain: Secondary | ICD-10-CM | POA: Diagnosis present

## 2023-11-21 DIAGNOSIS — M25661 Stiffness of right knee, not elsewhere classified: Secondary | ICD-10-CM | POA: Insufficient documentation

## 2023-11-21 NOTE — Therapy (Signed)
 OUTPATIENT PHYSICAL THERAPY THORACOLUMBAR EVALUATION   Patient Name: Natasha Walker MRN: [id number] DOB:1940-02-23, 83 y.o., female Today's Date: 11/23/2023  END OF SESSION:  PT End of Session - 11/22/23 0857     Visit Number 1    Number of Visits 17    Date for Recertification  01/18/24    Authorization Type 2x/week x 8 weeks (01/18/24)    PT Start Time 1030    PT Stop Time 1115    PT Time Calculation (min) 45 min    Activity Tolerance Patient tolerated treatment well    Behavior During Therapy WFL for tasks assessed/performed          Past Medical History:  Diagnosis Date   COPD (chronic obstructive pulmonary disease) (HCC)    History of cholecystectomy    Hypercholesteremia    Hypertension    TIA (transient ischemic attack)    several yrs ago. No deficits.   Vertigo    none recently   Wears dentures    partial upper   Past Surgical History:  Procedure Laterality Date   ABDOMINAL HYSTERECTOMY     BACK SURGERY     CATARACT EXTRACTION W/PHACO Left 10/20/2019   Procedure: CATARACT EXTRACTION PHACO AND INTRAOCULAR LENS PLACEMENT (IOC) LEFT ISTENT INJ 7.13  00:42.2;  Surgeon: Myrna Adine Anes, MD;  Location: [facility];  Service: Ophthalmology;  Laterality: Left;   CATARACT EXTRACTION W/PHACO Right 11/10/2019   Procedure: CATARACT EXTRACTION PHACO AND INTRAOCULAR LENS PLACEMENT (IOC) RIGHT;  Surgeon: Myrna Adine Anes, MD;  Location: [facility];  Service: Ophthalmology;  Laterality: Right;  5.52 0:44.0   TONSILLECTOMY     TUBAL LIGATION     Patient Active Problem List   Diagnosis Date Noted   Primary osteoarthritis of left knee 06/27/2020   Dilated cardiomyopathy (HCC) 07/18/2018   Chronic pain of left knee 12/04/2017   Venous insufficiency of both lower extremities 12/04/2017   Asthma 11/16/2017   Glaucoma 11/16/2017   Mixed hyperlipidemia 11/16/2017   Bradycardia 05/17/2017   S/P lumbar fusion 12/01/2016   Mild aortic valve stenosis  08/30/2016   Prolapse of anterior vaginal wall 12/02/2015   Bilateral carotid artery stenosis 04/13/2015   Spondylolisthesis of lumbar region 10/28/2014   Moderate mitral insufficiency 08/18/2014   Benign essential hypertension 06/22/2014   Lumbar stenosis 06/12/2014   Peripheral vascular disease 10/22/2013   Sciatica 10/17/2012   Acute on chronic diastolic heart failure (HCC) 08/08/2010   Cerebral artery occlusion with cerebral infarction (HCC) 08/06/2010    PCP: [facility]  REFERRING PROVIDER: Augustin Dasie Gaskins, [facility]  REFERRING DIAG:  [phone] (ICD-10-CM) - Other intervertebral disc degeneration, lumbar region without mention of lumbar back pain or lower extremity pain  Z98.1 (ICD-10-CM) - Arthrodesis status    Rationale for Evaluation and Treatment: Rehabilitation  THERAPY DIAG:  Other low back pain  Stiffness of left knee, not elsewhere classified  Stiffness of right knee, not elsewhere classified  Muscle weakness (generalized)  ONSET DATE: chronic  SUBJECTIVE:  SUBJECTIVE STATEMENT:  Pt c/o lower back pain; no specific recent MOI- she feels the sx progressively have worsened over the past few years; sometimes she uses a SPC- helps with being able to walk better  Agg: walking, prolonged standing, prolonged housework, driving ~69 minutes  Allev: sitting; heating pad   PERTINENT HISTORY:  B/l knee OA L>R; has seen orthopedics recently H/o lumbar fusion 2018; was referred to PT from her spine doctor  PAIN:  Are you having pain? Yes- 4-5/10 back  PRECAUTIONS: None  RED FLAGS: None   WEIGHT BEARING RESTRICTIONS: No  FALLS:  Has patient fallen in last 6 months? Yes. Number of falls 1 in driveway walking to get the mail  LIVING ENVIRONMENT: Lives with: lives alone Lives  in: House/apartment Stairs: No has 2 steps to enter Has following equipment at home: Single point cane Has a tub- she has to step over the edge of the tub- has a grab bar  OCCUPATION: enjoys sewing, enjoys being with flowers/gardening in her front yard (she sits in a chair), she has a church community   PLOF: Independent (she states her family is concerned and wants her to move closer, but she would like to maintain her independence)  PATIENT GOALS: to be able to move better and walk better with less pain; to be able to walk around in her yard a little better; to keep her strength up to maintain independence   NEXT MD VISIT: none scheduled currently  OBJECTIVE:  Note: Objective measures were completed at Evaluation unless otherwise noted.  DIAGNOSTIC FINDINGS:  September 2025 Knee x-ray L (per EPIC) IMPRESSION: Tricompartmental degenerative joint disease as described above. Small suprapatellar joint effusion. No fracture or dislocation.  PATIENT SURVEYS:  mODI- at visit #2  COGNITION: Overall cognitive status: Within functional limits for tasks assessed     SENSATION: WFL  POSTURE: decreased lumbar lordosis (has h/o multilevel spinal fusion)  PALPATION: TTP along lumbar paraspinal mm  LUMBAR ROM: major limited due to h/o lumbar fusion  AROM eval  Flexion   Extension   Right lateral flexion   Left lateral flexion   Right rotation   Left rotation    (Blank rows = not tested)  LOWER EXTREMITY ROM:     Active  Right eval Left eval  Hip flexion 95 95  Hip extension    Hip abduction    Hip adduction    Hip internal rotation    Hip external rotation    Knee flexion 95 85  Knee extension 5 5  Ankle dorsiflexion    Ankle plantarflexion    Ankle inversion    Ankle eversion     (Blank rows = not tested)  LOWER EXTREMITY MMT:    MMT Right eval Left eval  Hip flexion 4 4  Hip extension    Hip abduction    Hip adduction    Hip internal rotation    Hip  external rotation    Knee flexion 4 4  Knee extension 4 4  Ankle dorsiflexion 5 5  Ankle plantarflexion 3+ 3+  Ankle inversion    Ankle eversion     (Blank rows = not tested)   FUNCTIONAL TESTS:  5 times sit to stand: 19 seconds   SLS x 3-5 sec ea LE GAIT: Pt amb with decreased stride length b/l; occasionally uses her SPC but not today (has it in her purse with her)  TREATMENT DATE: 11/21/23  Initial evaluation performed  PATIENT EDUCATION:  Education details: PT POC/goals, discussed benefits of PT to address knee impairments to help facilitate improved overall function and to reduce excess strain on LBP Person educated: Patient Education method: Explanation Education comprehension: verbalized understanding  HOME EXERCISE PROGRAM: Formally initiated at visit #2  ASSESSMENT:  CLINICAL IMPRESSION: Patient is a 83 y.o. F who was seen today for physical therapy evaluation and treatment for low back pain.  Presents with low back and L>R knee pain, limited lumbar spine mobility due to h/o fusion, decreased LE mobility/ROM and strength, and deconditioning.  She is motivated to participate in tx and states she has benefited from PT in the past.    OBJECTIVE IMPAIRMENTS: Abnormal gait, decreased balance, decreased mobility, difficulty walking, decreased ROM, decreased strength, impaired perceived functional ability, and pain.   ACTIVITY LIMITATIONS: carrying, lifting, bending, standing, squatting, stairs, transfers, hygiene/grooming, and locomotion level  PARTICIPATION LIMITATIONS: meal prep, cleaning, community activity, and yard work  PERSONAL FACTORS: Time since onset of injury/illness/exacerbation and 1 comorbidity: knee OA/hypomobility are also affecting patient's functional outcome.   REHAB POTENTIAL: Excellent  CLINICAL DECISION MAKING:  Stable/uncomplicated  EVALUATION COMPLEXITY: Low   GOALS: Goals reviewed with patient? Yes  SHORT TERM GOALS: Target date: 12/08/23  Pt will demonstrate ability to perform HEP for LE strength/lumbopelvic strength and adhere to HEP 4x/week Baseline: to be initiated at visit 2 Goal status: INITIAL  LONG TERM GOALS: Target date: 01/18/24  Improve LE strength 1/2 MMT grade to facilitate improved ability to stand and perform her housework/yardwork without being limited by LBP Baseline: 4/5 Goal status: INITIAL  2.  Improve mODI >10 points indicating pt able to perform her daily activites without being as significantly limited by LBP Baseline:  Goal status: INITIAL  3.  Pt will be able to amb x 20 min on even/uneven surfaces without needing a sit rest break to be able to independently mow her yard, take care of flowers, and grocery shop without being limited by low back pain Baseline: 10 min intervals Goal status: INITIAL   PLAN:  PT FREQUENCY: 1-2x/week  PT DURATION: 8 weeks  PLANNED INTERVENTIONS: 97110-Therapeutic exercises, 97530- Therapeutic activity, [id number]- Neuromuscular re-education, 97535- Self Care, [id number]- Manual therapy, and Patient/Family education.  PLAN FOR NEXT SESSION: LE strengthening  Vernell Reges, PT, DPT, OCS  Vernell FORBES Reges, PT 11/23/2023, 8:58 AM

## 2023-11-23 ENCOUNTER — Ambulatory Visit

## 2023-11-23 DIAGNOSIS — M5459 Other low back pain: Secondary | ICD-10-CM | POA: Diagnosis not present

## 2023-11-23 DIAGNOSIS — M25661 Stiffness of right knee, not elsewhere classified: Secondary | ICD-10-CM

## 2023-11-23 DIAGNOSIS — M25662 Stiffness of left knee, not elsewhere classified: Secondary | ICD-10-CM

## 2023-11-23 DIAGNOSIS — M6281 Muscle weakness (generalized): Secondary | ICD-10-CM

## 2023-11-23 NOTE — Therapy (Signed)
 OUTPATIENT PHYSICAL THERAPY THORACOLUMBAR TREATMENT   Patient Name: Natasha Walker MRN: [id number] DOB:March 12, 1940, 83 y.o., female Today's Date: 11/23/2023  END OF SESSION:  PT End of Session - 11/23/23 1218     Visit Number 2    Number of Visits 17    Date for Recertification  01/18/24    Authorization Type 2x/week x 8 weeks (01/18/24)    PT Start Time 0845    PT Stop Time 0930    PT Time Calculation (min) 45 min    Activity Tolerance Patient tolerated treatment well    Behavior During Therapy WFL for tasks assessed/performed          Past Medical History:  Diagnosis Date   COPD (chronic obstructive pulmonary disease) (HCC)    History of cholecystectomy    Hypercholesteremia    Hypertension    TIA (transient ischemic attack)    several yrs ago. No deficits.   Vertigo    none recently   Wears dentures    partial upper   Past Surgical History:  Procedure Laterality Date   ABDOMINAL HYSTERECTOMY     BACK SURGERY     CATARACT EXTRACTION W/PHACO Left 10/20/2019   Procedure: CATARACT EXTRACTION PHACO AND INTRAOCULAR LENS PLACEMENT (IOC) LEFT ISTENT INJ 7.13  00:42.2;  Surgeon: Myrna Adine Anes, MD;  Location: [facility];  Service: Ophthalmology;  Laterality: Left;   CATARACT EXTRACTION W/PHACO Right 11/10/2019   Procedure: CATARACT EXTRACTION PHACO AND INTRAOCULAR LENS PLACEMENT (IOC) RIGHT;  Surgeon: Myrna Adine Anes, MD;  Location: [facility];  Service: Ophthalmology;  Laterality: Right;  5.52 0:44.0   TONSILLECTOMY     TUBAL LIGATION     Patient Active Problem List   Diagnosis Date Noted   Primary osteoarthritis of left knee 06/27/2020   Dilated cardiomyopathy (HCC) 07/18/2018   Chronic pain of left knee 12/04/2017   Venous insufficiency of both lower extremities 12/04/2017   Asthma 11/16/2017   Glaucoma 11/16/2017   Mixed hyperlipidemia 11/16/2017   Bradycardia 05/17/2017   S/P lumbar fusion 12/01/2016   Mild aortic valve stenosis  08/30/2016   Prolapse of anterior vaginal wall 12/02/2015   Bilateral carotid artery stenosis 04/13/2015   Spondylolisthesis of lumbar region 10/28/2014   Moderate mitral insufficiency 08/18/2014   Benign essential hypertension 06/22/2014   Lumbar stenosis 06/12/2014   Peripheral vascular disease 10/22/2013   Sciatica 10/17/2012   Acute on chronic diastolic heart failure (HCC) 08/08/2010   Cerebral artery occlusion with cerebral infarction (HCC) 08/06/2010    PCP: [facility]  REFERRING PROVIDER: Augustin Dasie Gaskins, [facility]  REFERRING DIAG:  [phone] (ICD-10-CM) - Other intervertebral disc degeneration, lumbar region without mention of lumbar back pain or lower extremity pain  Z98.1 (ICD-10-CM) - Arthrodesis status    Rationale for Evaluation and Treatment: Rehabilitation  THERAPY DIAG:  Muscle weakness (generalized)  Other low back pain  Stiffness of left knee, not elsewhere classified  Stiffness of right knee, not elsewhere classified  ONSET DATE: chronic  SUBJECTIVE:  SUBJECTIVE STATEMENT:  Pt c/o lower back pain; no specific recent MOI- she feels the sx progressively have worsened over the past few years; sometimes she uses a SPC- helps with being able to walk better  Agg: walking, prolonged standing, prolonged housework, driving ~69 minutes  Allev: sitting; heating pad   PERTINENT HISTORY:  B/l knee OA L>R; has seen orthopedics recently H/o lumbar fusion 2018; was referred to PT from her spine doctor  PAIN:  Are you having pain? Yes- 4-5/10 back  PRECAUTIONS: None  RED FLAGS: None   WEIGHT BEARING RESTRICTIONS: No  FALLS:  Has patient fallen in last 6 months? Yes. Number of falls 1 in driveway walking to get the mail  LIVING ENVIRONMENT: Lives with: lives alone Lives  in: House/apartment Stairs: No has 2 steps to enter Has following equipment at home: Single point cane Has a tub- she has to step over the edge of the tub- has a grab bar  OCCUPATION: enjoys sewing, enjoys being with flowers/gardening in her front yard (she sits in a chair), she has a church community   PLOF: Independent (she states her family is concerned and wants her to move closer, but she would like to maintain her independence)  PATIENT GOALS: to be able to move better and walk better with less pain; to be able to walk around in her yard a little better; to keep her strength up to maintain independence   NEXT MD VISIT: none scheduled currently  OBJECTIVE:  Note: Objective measures were completed at Evaluation unless otherwise noted.  DIAGNOSTIC FINDINGS:  September 2025 Knee x-ray L (per EPIC) IMPRESSION: Tricompartmental degenerative joint disease as described above. Small suprapatellar joint effusion. No fracture or dislocation.  PATIENT SURVEYS:  mODI- at visit #2  COGNITION: Overall cognitive status: Within functional limits for tasks assessed     SENSATION: WFL  POSTURE: decreased lumbar lordosis (has h/o multilevel spinal fusion)  PALPATION: TTP along lumbar paraspinal mm  LUMBAR ROM: major limited due to h/o lumbar fusion  AROM eval  Flexion   Extension   Right lateral flexion   Left lateral flexion   Right rotation   Left rotation    (Blank rows = not tested)  LOWER EXTREMITY ROM:     Active  Right eval Left eval  Hip flexion 95 95  Hip extension    Hip abduction    Hip adduction    Hip internal rotation    Hip external rotation    Knee flexion 95 85  Knee extension 5 5  Ankle dorsiflexion    Ankle plantarflexion    Ankle inversion    Ankle eversion     (Blank rows = not tested)  LOWER EXTREMITY MMT:    MMT Right eval Left eval  Hip flexion 4 4  Hip extension    Hip abduction    Hip adduction    Hip internal rotation    Hip  external rotation    Knee flexion 4 4  Knee extension 4 4  Ankle dorsiflexion 5 5  Ankle plantarflexion 3+ 3+  Ankle inversion    Ankle eversion     (Blank rows = not tested)   FUNCTIONAL TESTS:  5 times sit to stand: 19 seconds   SLS x 3-5 sec ea LE GAIT: Pt amb with decreased stride length b/l; occasionally uses her SPC but not today (has it in her purse with her)  TREATMENT DATE: 11/23/23  Subjective: Pt is motivated to participate in PT.  Back isn't sore upon arrival. Knees feel stiff.  Objective:  Knee pain 3/10 L>R  Therapeutic Exercise LAQ 3# 2x12 ea LE Heel raises: 2x10 b/l Nustep x 10 min level 3 (seat #9) Initiated HEP- see below  Therapeutic Activities Squats (partial range): 2x8 Standing hip abd 2x10 ea LE Standing hip ext 2x10 ea LE    PATIENT EDUCATION:  Education details: PT POC/goals, discussed benefits of PT to address knee impairments to help facilitate improved overall function and to reduce excess strain on LBP Person educated: Patient Education method: Explanation Education comprehension: verbalized understanding  HOME EXERCISE PROGRAM:  Access Code: [facility] URL: [email or web address] Date: 11/23/2023 Prepared by: Vernell Reges  Exercises - Standing Hip Abduction with Counter Support  - 1 x daily - 7 x weekly - 2 sets - 10 reps - Standing Hip Extension with Counter Support  - 1 x daily - 7 x weekly - 2 sets - 10 reps - Heel Raises with Counter Support  - 1 x daily - 7 x weekly - 2 sets - 10 reps - Mini Squat with Counter Support  - 1 x daily - 7 x weekly - 2 sets - 10 reps - Seated Long Arc Quad  - 1 x daily - 7 x weekly - 2 sets - 10 reps ASSESSMENT:  CLINICAL IMPRESSION: Patient is a 83 y.o. F who was seen today for physical therapy treatment for low back pain.  Presents with low back and L>R knee  pain, limited lumbar spine mobility due to h/o fusion, decreased LE mobility/ROM and strength, and deconditioning.  She is motivated to participate in tx and states she has benefited from PT in the past.  Tolerated today's session well without c/o increased pain in back or knees at end of tx.  She is deconditioned and required a few brief sitting rest breaks today.  Overall, should cont to benefit from skilled PT to facilitate improved QOL, improved activity tolerance, and improved safety so she can independently stay in her home.  OBJECTIVE IMPAIRMENTS: Abnormal gait, decreased balance, decreased mobility, difficulty walking, decreased ROM, decreased strength, impaired perceived functional ability, and pain.   ACTIVITY LIMITATIONS: carrying, lifting, bending, standing, squatting, stairs, transfers, hygiene/grooming, and locomotion level  PARTICIPATION LIMITATIONS: meal prep, cleaning, community activity, and yard work  PERSONAL FACTORS: Time since onset of injury/illness/exacerbation and 1 comorbidity: knee OA/hypomobility are also affecting patient's functional outcome.   REHAB POTENTIAL: Excellent  CLINICAL DECISION MAKING: Stable/uncomplicated  EVALUATION COMPLEXITY: Low   GOALS: Goals reviewed with patient? Yes  SHORT TERM GOALS: Target date: 12/08/23  Pt will demonstrate ability to perform HEP for LE strength/lumbopelvic strength and adhere to HEP 4x/week Baseline: to be initiated at visit 2 Goal status: INITIAL  LONG TERM GOALS: Target date: 01/18/24  Improve LE strength 1/2 MMT grade to facilitate improved ability to stand and perform her housework/yardwork without being limited by LBP Baseline: 4/5 Goal status: INITIAL  2.  Improve mODI >10 points indicating pt able to perform her daily activites without being as significantly limited by LBP Baseline:  Goal status: INITIAL  3.  Pt will be able to amb x 20 min on even/uneven surfaces without needing a sit rest break to be  able to independently mow her yard, take care of flowers, and grocery shop without being limited by low back pain Baseline: 10 min intervals Goal status: INITIAL   PLAN:  PT FREQUENCY: 1-2x/week  PT DURATION: 8 weeks  PLANNED INTERVENTIONS: 97110-Therapeutic exercises, 97530- Therapeutic activity, [id number]- Neuromuscular re-education, 97535- Self Care, [id number]- Manual therapy, and Patient/Family education.  PLAN FOR NEXT SESSION: LE strengthening  Vernell Reges, PT, DPT, OCS  Vernell FORBES Reges, PT 11/23/2023, 12:20 PM

## 2023-11-28 ENCOUNTER — Ambulatory Visit

## 2023-11-28 DIAGNOSIS — M5459 Other low back pain: Secondary | ICD-10-CM | POA: Diagnosis not present

## 2023-11-28 DIAGNOSIS — M25662 Stiffness of left knee, not elsewhere classified: Secondary | ICD-10-CM

## 2023-11-28 DIAGNOSIS — M25661 Stiffness of right knee, not elsewhere classified: Secondary | ICD-10-CM

## 2023-11-28 DIAGNOSIS — M6281 Muscle weakness (generalized): Secondary | ICD-10-CM

## 2023-11-28 NOTE — Therapy (Signed)
 OUTPATIENT PHYSICAL THERAPY THORACOLUMBAR TREATMENT   Patient Name: Natasha Walker MRN: [id number] DOB:04/03/1940, 83 y.o., female Today's Date: 11/28/2023  END OF SESSION:  PT End of Session - 11/28/23 1029     Visit Number 3    Number of Visits 17    Date for Recertification  01/18/24    Authorization Type 2x/week x 8 weeks (01/18/24)    PT Start Time 1030    PT Stop Time 1115    PT Time Calculation (min) 45 min    Activity Tolerance Patient tolerated treatment well    Behavior During Therapy WFL for tasks assessed/performed          Past Medical History:  Diagnosis Date   COPD (chronic obstructive pulmonary disease) (HCC)    History of cholecystectomy    Hypercholesteremia    Hypertension    TIA (transient ischemic attack)    several yrs ago. No deficits.   Vertigo    none recently   Wears dentures    partial upper   Past Surgical History:  Procedure Laterality Date   ABDOMINAL HYSTERECTOMY     BACK SURGERY     CATARACT EXTRACTION W/PHACO Left 10/20/2019   Procedure: CATARACT EXTRACTION PHACO AND INTRAOCULAR LENS PLACEMENT (IOC) LEFT ISTENT INJ 7.13  00:42.2;  Surgeon: Myrna Adine Anes, MD;  Location: [facility];  Service: Ophthalmology;  Laterality: Left;   CATARACT EXTRACTION W/PHACO Right 11/10/2019   Procedure: CATARACT EXTRACTION PHACO AND INTRAOCULAR LENS PLACEMENT (IOC) RIGHT;  Surgeon: Myrna Adine Anes, MD;  Location: [facility];  Service: Ophthalmology;  Laterality: Right;  5.52 0:44.0   TONSILLECTOMY     TUBAL LIGATION     Patient Active Problem List   Diagnosis Date Noted   Primary osteoarthritis of left knee 06/27/2020   Dilated cardiomyopathy (HCC) 07/18/2018   Chronic pain of left knee 12/04/2017   Venous insufficiency of both lower extremities 12/04/2017   Asthma 11/16/2017   Glaucoma 11/16/2017   Mixed hyperlipidemia 11/16/2017   Bradycardia 05/17/2017   S/P lumbar fusion 12/01/2016   Mild aortic valve stenosis  08/30/2016   Prolapse of anterior vaginal wall 12/02/2015   Bilateral carotid artery stenosis 04/13/2015   Spondylolisthesis of lumbar region 10/28/2014   Moderate mitral insufficiency 08/18/2014   Benign essential hypertension 06/22/2014   Lumbar stenosis 06/12/2014   Peripheral vascular disease 10/22/2013   Sciatica 10/17/2012   Acute on chronic diastolic heart failure (HCC) 08/08/2010   Cerebral artery occlusion with cerebral infarction (HCC) 08/06/2010    PCP: [facility]  REFERRING PROVIDER: Augustin Dasie Gaskins, [facility]  REFERRING DIAG:  [phone] (ICD-10-CM) - Other intervertebral disc degeneration, lumbar region without mention of lumbar back pain or lower extremity pain  Z98.1 (ICD-10-CM) - Arthrodesis status    Rationale for Evaluation and Treatment: Rehabilitation  THERAPY DIAG:  Muscle weakness (generalized)  Other low back pain  Stiffness of left knee, not elsewhere classified  Stiffness of right knee, not elsewhere classified  ONSET DATE: chronic  SUBJECTIVE:  SUBJECTIVE STATEMENT:  Pt c/o lower back pain; no specific recent MOI- she feels the sx progressively have worsened over the past few years; sometimes she uses a SPC- helps with being able to walk better  Agg: walking, prolonged standing, prolonged housework, driving ~69 minutes  Allev: sitting; heating pad   PERTINENT HISTORY:  B/l knee OA L>R; has seen orthopedics recently H/o lumbar fusion 2018; was referred to PT from her spine doctor  PAIN:  Are you having pain? Yes- 4-5/10 back  PRECAUTIONS: None  RED FLAGS: None   WEIGHT BEARING RESTRICTIONS: No  FALLS:  Has patient fallen in last 6 months? Yes. Number of falls 1 in driveway walking to get the mail  LIVING ENVIRONMENT: Lives with: lives alone Lives  in: House/apartment Stairs: No has 2 steps to enter Has following equipment at home: Single point cane Has a tub- she has to step over the edge of the tub- has a grab bar  OCCUPATION: enjoys sewing, enjoys being with flowers/gardening in her front yard (she sits in a chair), she has a church community   PLOF: Independent (she states her family is concerned and wants her to move closer, but she would like to maintain her independence)  PATIENT GOALS: to be able to move better and walk better with less pain; to be able to walk around in her yard a little better; to keep her strength up to maintain independence   NEXT MD VISIT: none scheduled currently  OBJECTIVE:  Note: Objective measures were completed at Evaluation unless otherwise noted.  DIAGNOSTIC FINDINGS:  September 2025 Knee x-ray L (per EPIC) IMPRESSION: Tricompartmental degenerative joint disease as described above. Small suprapatellar joint effusion. No fracture or dislocation.  PATIENT SURVEYS:  mODI- at visit #2  COGNITION: Overall cognitive status: Within functional limits for tasks assessed     SENSATION: WFL  POSTURE: decreased lumbar lordosis (has h/o multilevel spinal fusion)  PALPATION: TTP along lumbar paraspinal mm  LUMBAR ROM: major limited due to h/o lumbar fusion  AROM eval  Flexion   Extension   Right lateral flexion   Left lateral flexion   Right rotation   Left rotation    (Blank rows = not tested)  LOWER EXTREMITY ROM:     Active  Right eval Left eval  Hip flexion 95 95  Hip extension    Hip abduction    Hip adduction    Hip internal rotation    Hip external rotation    Knee flexion 95 85  Knee extension 5 5  Ankle dorsiflexion    Ankle plantarflexion    Ankle inversion    Ankle eversion     (Blank rows = not tested)  LOWER EXTREMITY MMT:    MMT Right eval Left eval  Hip flexion 4 4  Hip extension    Hip abduction    Hip adduction    Hip internal rotation    Hip  external rotation    Knee flexion 4 4  Knee extension 4 4  Ankle dorsiflexion 5 5  Ankle plantarflexion 3+ 3+  Ankle inversion    Ankle eversion     (Blank rows = not tested)   FUNCTIONAL TESTS:  5 times sit to stand: 19 seconds   SLS x 3-5 sec ea LE GAIT: Pt amb with decreased stride length b/l; occasionally uses her SPC but not today (has it in her purse with her)  TREATMENT DATE: 11/28/23  Subjective: Pt is motivated to participate in PT.  Back isn't sore upon arrival. Knees feel stiff/sore (L knee).  Her back was sore yesterday, she did a lot more walking than usual as she visited her niece in the hospital and it was a long walk.  No falls since last time.  Would like to work on HEP more consistently, tried it 2x.  Objective:  Knee pain 4/10 L>R  Therapeutic Exercise LAQ 3# 2x12 ea LE Heel raises: 2x10 b/l Nustep x 10 min level 3 (seat #8) Seated marches: 3# 2x10 ea  SLR x8 R and x8 L Hooklying LTR x 10 ea direction PROM R knee x5, L knee x5  Therapeutic Activities Squats (partial range): 2x8 Standing hip abd 2x10 ea LE Standing hip ext 2x10 ea LE Reviewed HEP and discussed strategies to improve HEP adherence, she reports doing HEP in early day will feel most achievable     PATIENT EDUCATION:  Education details: PT POC/goals, discussed benefits of PT to address knee impairments to help facilitate improved overall function and to reduce excess strain on LBP Person educated: Patient Education method: Explanation Education comprehension: verbalized understanding  HOME EXERCISE PROGRAM: Access Code: [facility] URL: [email or web address] Date: 11/23/2023 Prepared by: Vernell Reges  Exercises - Standing Hip Abduction with Counter Support  - 1 x daily - 7 x weekly - 2 sets - 10 reps - Standing Hip Extension with Counter Support   - 1 x daily - 7 x weekly - 2 sets - 10 reps - Heel Raises with Counter Support  - 1 x daily - 7 x weekly - 2 sets - 10 reps - Mini Squat with Counter Support  - 1 x daily - 7 x weekly - 2 sets - 10 reps - Seated Long Arc Quad  - 1 x daily - 7 x weekly - 2 sets - 10 reps ASSESSMENT:  CLINICAL IMPRESSION: Patient is a 83 y.o. F who was seen today for physical therapy treatment for low back pain.  Presents with low back and L>R knee pain, limited lumbar spine mobility due to h/o fusion, decreased LE mobility/ROM and strength, and deconditioning.  Tolerated knee PROM well and was able to perform strengthening exercises without c/o increased LBP or knee pain at end of session.  Was quite challenged with SLR.  Discussed strategies to improve adherence to HEP and benefit of performing HEP on days she is not here in clinic.  Required only 1 brief sitting break during session.  Overall, should cont to benefit from skilled PT to facilitate improved QOL, improved activity tolerance, and improved safety so she can independently stay in her home.  OBJECTIVE IMPAIRMENTS: Abnormal gait, decreased balance, decreased mobility, difficulty walking, decreased ROM, decreased strength, impaired perceived functional ability, and pain.   ACTIVITY LIMITATIONS: carrying, lifting, bending, standing, squatting, stairs, transfers, hygiene/grooming, and locomotion level  PARTICIPATION LIMITATIONS: meal prep, cleaning, community activity, and yard work  PERSONAL FACTORS: Time since onset of injury/illness/exacerbation and 1 comorbidity: knee OA/hypomobility are also affecting patient's functional outcome.   REHAB POTENTIAL: Excellent  CLINICAL DECISION MAKING: Stable/uncomplicated  EVALUATION COMPLEXITY: Low   GOALS: Goals reviewed with patient? Yes  SHORT TERM GOALS: Target date: 12/08/23  Pt will demonstrate ability to perform HEP for LE strength/lumbopelvic strength and adhere to HEP 4x/week Baseline: to be  initiated at visit 2 Goal status: INITIAL  LONG TERM GOALS: Target date: 01/18/24  Improve LE strength 1/2 MMT grade to facilitate improved ability to stand and perform her  housework/yardwork without being limited by LBP Baseline: 4/5 Goal status: INITIAL  2.  Improve mODI >10 points indicating pt able to perform her daily activites without being as significantly limited by LBP Baseline:  Goal status: INITIAL  3.  Pt will be able to amb x 20 min on even/uneven surfaces without needing a sit rest break to be able to independently mow her yard, take care of flowers, and grocery shop without being limited by low back pain Baseline: 10 min intervals Goal status: INITIAL   PLAN:  PT FREQUENCY: 1-2x/week  PT DURATION: 8 weeks  PLANNED INTERVENTIONS: 97110-Therapeutic exercises, 97530- Therapeutic activity, [id number]- Neuromuscular re-education, 97535- Self Care, [id number]- Manual therapy, and Patient/Family education.  PLAN FOR NEXT SESSION: LE strengthening  Vernell Reges, PT, DPT, OCS  Kyro Joswick E Leonna Schlee, PT 11/28/2023, 10:30 AM

## 2023-12-03 ENCOUNTER — Ambulatory Visit

## 2023-12-06 ENCOUNTER — Ambulatory Visit: Admitting: Physical Therapy

## 2023-12-06 ENCOUNTER — Encounter: Payer: Self-pay | Admitting: Physical Therapy

## 2023-12-06 DIAGNOSIS — M6281 Muscle weakness (generalized): Secondary | ICD-10-CM

## 2023-12-06 DIAGNOSIS — M25661 Stiffness of right knee, not elsewhere classified: Secondary | ICD-10-CM

## 2023-12-06 DIAGNOSIS — M25662 Stiffness of left knee, not elsewhere classified: Secondary | ICD-10-CM

## 2023-12-06 DIAGNOSIS — M5459 Other low back pain: Secondary | ICD-10-CM

## 2023-12-06 NOTE — Therapy (Addendum)
 OUTPATIENT PHYSICAL THERAPY THORACOLUMBAR TREATMENT   Patient Name: Natasha Walker MRN: [id number] DOB:23-Aug-1940, 83 y.o., female Today's Date: 12/06/2023  END OF SESSION:  PT End of Session - 12/06/23 0813     Visit Number 4    Number of Visits 17    Date for Recertification  01/18/24    Authorization Type 2x/week x 8 weeks (01/18/24)    PT Start Time 0813    PT Stop Time 0856    PT Time Calculation (min) 43 min    Activity Tolerance Patient tolerated treatment well    Behavior During Therapy WFL for tasks assessed/performed           Past Medical History:  Diagnosis Date   COPD (chronic obstructive pulmonary disease) (HCC)    History of cholecystectomy    Hypercholesteremia    Hypertension    TIA (transient ischemic attack)    several yrs ago. No deficits.   Vertigo    none recently   Wears dentures    partial upper   Past Surgical History:  Procedure Laterality Date   ABDOMINAL HYSTERECTOMY     BACK SURGERY     CATARACT EXTRACTION W/PHACO Left 10/20/2019   Procedure: CATARACT EXTRACTION PHACO AND INTRAOCULAR LENS PLACEMENT (IOC) LEFT ISTENT INJ 7.13  00:42.2;  Surgeon: Myrna Adine Anes, MD;  Location: [facility];  Service: Ophthalmology;  Laterality: Left;   CATARACT EXTRACTION W/PHACO Right 11/10/2019   Procedure: CATARACT EXTRACTION PHACO AND INTRAOCULAR LENS PLACEMENT (IOC) RIGHT;  Surgeon: Myrna Adine Anes, MD;  Location: [facility];  Service: Ophthalmology;  Laterality: Right;  5.52 0:44.0   TONSILLECTOMY     TUBAL LIGATION     Patient Active Problem List   Diagnosis Date Noted   Primary osteoarthritis of left knee 06/27/2020   Dilated cardiomyopathy (HCC) 07/18/2018   Chronic pain of left knee 12/04/2017   Venous insufficiency of both lower extremities 12/04/2017   Asthma 11/16/2017   Glaucoma 11/16/2017   Mixed hyperlipidemia 11/16/2017   Bradycardia 05/17/2017   S/P lumbar fusion 12/01/2016   Mild aortic valve stenosis  08/30/2016   Prolapse of anterior vaginal wall 12/02/2015   Bilateral carotid artery stenosis 04/13/2015   Spondylolisthesis of lumbar region 10/28/2014   Moderate mitral insufficiency 08/18/2014   Benign essential hypertension 06/22/2014   Lumbar stenosis 06/12/2014   Peripheral vascular disease 10/22/2013   Sciatica 10/17/2012   Acute on chronic diastolic heart failure (HCC) 08/08/2010   Cerebral artery occlusion with cerebral infarction (HCC) 08/06/2010    PCP: [facility]  REFERRING PROVIDER: Augustin Dasie Gaskins, [facility]  REFERRING DIAG:  [phone] (ICD-10-CM) - Other intervertebral disc degeneration, lumbar region without mention of lumbar back pain or lower extremity pain  Z98.1 (ICD-10-CM) - Arthrodesis status    Rationale for Evaluation and Treatment: Rehabilitation  THERAPY DIAG:  Muscle weakness (generalized)  Other low back pain  Stiffness of left knee, not elsewhere classified  Stiffness of right knee, not elsewhere classified  ONSET DATE: chronic  SUBJECTIVE:  SUBJECTIVE STATEMENT:  Pt c/o lower back pain; no specific recent MOI- she feels the sx progressively have worsened over the past few years; sometimes she uses a SPC- helps with being able to walk better  Agg: walking, prolonged standing, prolonged housework, driving ~69 minutes  Allev: sitting; heating pad   PERTINENT HISTORY:  B/l knee OA L>R; has seen orthopedics recently H/o lumbar fusion 2018; was referred to PT from her spine doctor  PAIN:  Are you having pain? Yes- 4-5/10 back  PRECAUTIONS: None  RED FLAGS: None   WEIGHT BEARING RESTRICTIONS: No  FALLS:  Has patient fallen in last 6 months? Yes. Number of falls 1 in driveway walking to get the mail  LIVING ENVIRONMENT: Lives with: lives alone Lives  in: House/apartment Stairs: No has 2 steps to enter Has following equipment at home: Single point cane Has a tub- she has to step over the edge of the tub- has a grab bar  OCCUPATION: enjoys sewing, enjoys being with flowers/gardening in her front yard (she sits in a chair), she has a church community   PLOF: Independent (she states her family is concerned and wants her to move closer, but she would like to maintain her independence)  PATIENT GOALS: to be able to move better and walk better with less pain; to be able to walk around in her yard a little better; to keep her strength up to maintain independence   NEXT MD VISIT: none scheduled currently  OBJECTIVE:  Note: Objective measures were completed at Evaluation unless otherwise noted.  DIAGNOSTIC FINDINGS:  September 2025 Knee x-ray L (per EPIC) IMPRESSION: Tricompartmental degenerative joint disease as described above. Small suprapatellar joint effusion. No fracture or dislocation.  PATIENT SURVEYS:  mODI- at visit #2  COGNITION: Overall cognitive status: Within functional limits for tasks assessed     SENSATION: WFL  POSTURE: decreased lumbar lordosis (has h/o multilevel spinal fusion)  PALPATION: TTP along lumbar paraspinal mm  LUMBAR ROM: major limited due to h/o lumbar fusion  AROM eval  Flexion   Extension   Right lateral flexion   Left lateral flexion   Right rotation   Left rotation    (Blank rows = not tested)  LOWER EXTREMITY ROM:     Active  Right eval Left eval  Hip flexion 95 95  Hip extension    Hip abduction    Hip adduction    Hip internal rotation    Hip external rotation    Knee flexion 95 85  Knee extension 5 5  Ankle dorsiflexion    Ankle plantarflexion    Ankle inversion    Ankle eversion     (Blank rows = not tested)  LOWER EXTREMITY MMT:    MMT Right eval Left eval  Hip flexion 4 4  Hip extension    Hip abduction    Hip adduction    Hip internal rotation    Hip  external rotation    Knee flexion 4 4  Knee extension 4 4  Ankle dorsiflexion 5 5  Ankle plantarflexion 3+ 3+  Ankle inversion    Ankle eversion     (Blank rows = not tested)   FUNCTIONAL TESTS:  5 times sit to stand: 19 seconds   SLS x 3-5 sec ea LE GAIT: Pt amb with decreased stride length b/l; occasionally uses her SPC but not today (has it in her purse with her)  TREATMENT DATE:  12/06/2023  Subjective: Pt is motivated to participate in PT. She reports some pain in her knees at arrival. She feels her back is doing pretty good this AM. Patient states that she has long-time friend's funeral in [place]  that she will have to attend. Her family states that she will be in [place]  until Thanksgiving. Pt will likely return after Thanksgiving.   Objective:  Knee pain 4-5/10 L>R  Therapeutic Exercise Nustep x 7 min level 3 (seat #7) Hooklying LTR x 10 ea direction PROM R knee x5, L knee x5 Bridges; 2 x 10 SLR x10 R and x9 L (L knee significant fatigue at 9th rep) LAQ 3# 2x12 ea LE, alternating R/L Seated marches: 3# 2x10 ea   *not today* Heel raises: 2x10 b/l   Therapeutic Activities Standing 3-way hip; 2 x 8 ea dir, bilat with 2# AW  HEP updated and reviewed; discussed available equipment and resources at her daughter's house in [place].   *not today* Squats (partial range): 2x8 Standing hip abd 2x10 ea LE Standing hip ext 2x10 ea LE   PATIENT EDUCATION:  Education details: PT POC/goals, discussed benefits of PT to address knee impairments to help facilitate improved overall function and to reduce excess strain on LBP Person educated: Patient Education method: Explanation Education comprehension: verbalized understanding  HOME EXERCISE PROGRAM: Access Code: [facility] URL: [email or web address] Date: 12/06/2023 Prepared by: Venetia Endo  Exercises - Active Straight Leg Raise with Quad Set  - 1 x daily - 7 x weekly - 1-2 sets - 10 reps - Supine Bridge  - 1 x daily - 7 x weekly - 2 sets - 10 reps - Seated Long Arc Quad  - 1 x daily - 7 x weekly - 2 sets - 10 reps - Heel Raises with Counter Support  - 1 x daily - 7 x weekly - 2 sets - 10 reps - Mini Squat with Counter Support  - 1 x daily - 7 x weekly - 2 sets - 10 reps - Standing 3-Way Kick  - 1 x daily - 7 x weekly - 2 sets - 10 reps   ASSESSMENT:  CLINICAL IMPRESSION: Patient has made good progress over first 2 weeks of PT. She has been able to gradually progress intensity and volume for LE strengthening. She feels that she has made progress with PT to date, and she wishes to continue. However, pt will be in [place]  over month of November to go to a friend's funeral and stay for Thanksgiving. Pt planning to return early December to resume plan of care. We updated her HEP to include additional specific strengthening drills and add new exercises introduced over previous 2 weeks. Overall, should cont to benefit from skilled PT to facilitate improved QOL, improved activity tolerance, and improved safety so she can independently stay in her home.  OBJECTIVE IMPAIRMENTS: Abnormal gait, decreased balance, decreased mobility, difficulty walking, decreased ROM, decreased strength, impaired perceived functional ability, and pain.   ACTIVITY LIMITATIONS: carrying, lifting, bending, standing, squatting, stairs, transfers, hygiene/grooming, and locomotion level  PARTICIPATION LIMITATIONS: meal prep, cleaning, community activity, and yard work  PERSONAL FACTORS: Time since onset of injury/illness/exacerbation and 1 comorbidity: knee OA/hypomobility are also affecting patient's functional outcome.   REHAB POTENTIAL: Excellent  CLINICAL DECISION MAKING: Stable/uncomplicated  EVALUATION COMPLEXITY: Low   GOALS: Goals reviewed with patient? Yes  SHORT TERM GOALS: Target  date: 12/08/23  Pt will demonstrate ability to perform HEP for LE strength/lumbopelvic strength and adhere to HEP 4x/week Baseline:  to be initiated at visit 2 Goal status: INITIAL  LONG TERM GOALS: Target date: 01/18/24  Improve LE strength 1/2 MMT grade to facilitate improved ability to stand and perform her housework/yardwork without being limited by LBP Baseline: 4/5 Goal status: INITIAL  2.  Improve mODI >10 points indicating pt able to perform her daily activites without being as significantly limited by LBP Baseline:  Goal status: INITIAL  3.  Pt will be able to amb x 20 min on even/uneven surfaces without needing a sit rest break to be able to independently mow her yard, take care of flowers, and grocery shop without being limited by low back pain Baseline: 10 min intervals Goal status: INITIAL   PLAN:  PT FREQUENCY: 1-2x/week  PT DURATION: 8 weeks  PLANNED INTERVENTIONS: 97110-Therapeutic exercises, 97530- Therapeutic activity, [id number]- Neuromuscular re-education, 97535- Self Care, [id number]- Manual therapy, and Patient/Family education.  PLAN FOR NEXT SESSION: LE strengthening. Pt to return to state following trip to [place] over month of November; pt to return after Thanksgiving.    Venetia Endo, PT, DPT #[id number]  Venetia ONEIDA Endo, PT 12/06/2023, 9:32 AM

## 2023-12-10 ENCOUNTER — Ambulatory Visit

## 2023-12-12 ENCOUNTER — Encounter

## 2023-12-19 ENCOUNTER — Encounter

## 2023-12-21 ENCOUNTER — Encounter

## 2023-12-24 ENCOUNTER — Encounter

## 2023-12-26 ENCOUNTER — Encounter

## 2023-12-31 ENCOUNTER — Encounter

## 2024-01-02 ENCOUNTER — Encounter

## 2024-02-16 ENCOUNTER — Ambulatory Visit
Admission: EM | Admit: 2024-02-16 | Discharge: 2024-02-16 | Disposition: A | Discharge status: Home / Self Care | Attending: Family Medicine | Admitting: Family Medicine

## 2024-02-16 DIAGNOSIS — J111 Influenza due to unidentified influenza virus with other respiratory manifestations: Secondary | ICD-10-CM

## 2024-02-16 DIAGNOSIS — J101 Influenza due to other identified influenza virus with other respiratory manifestations: Secondary | ICD-10-CM

## 2024-02-16 DIAGNOSIS — R197 Diarrhea, unspecified: Secondary | ICD-10-CM

## 2024-02-16 LAB — POC COVID19/FLU A&B COMBO
Covid Antigen, POC: NEGATIVE
Influenza A Antigen, POC: POSITIVE — AB
Influenza B Antigen, POC: NEGATIVE

## 2024-02-16 MED ORDER — ACETAMINOPHEN 325 MG PO TABS
975.0000 mg | ORAL_TABLET | Freq: Once | ORAL | Status: AC
  Administered 2024-02-16: 975 mg via ORAL

## 2024-02-16 MED ORDER — OSELTAMIVIR PHOSPHATE 30 MG PO CAPS: 30.0000 mg | ORAL_CAPSULE | Freq: Two times a day (BID) | ORAL | 0 refills | Status: AC

## 2024-02-16 NOTE — ED Triage Notes (Signed)
 Sx x 2 days  Bodyaches Cold Headache  Unknown exposure to anything

## 2024-02-16 NOTE — ED Provider Notes (Signed)
 " [facility]    CSN: [id number] Arrival date & time: 02/16/24  1549      History   Chief Complaint Chief Complaint  Patient presents with   Generalized Body Aches    HPI Natasha Walker is a 84 y.o. female.   HPI  History obtained from the patient. Chrishonda presents for 1-2 days of body aches, chills, slight cough, nasal congestion, diarrhea. Was feeling better this morning but things just got worse. No known fever. Took some Tylenol  but none today. She was in church last weekend but unsure if anyone was sick.      Past Medical History:  Diagnosis Date   COPD (chronic obstructive pulmonary disease) (HCC)    History of cholecystectomy    Hypercholesteremia    Hypertension    TIA (transient ischemic attack)    several yrs ago. No deficits.   Vertigo    none recently   Wears dentures    partial upper    Patient Active Problem List   Diagnosis Date Noted   Primary osteoarthritis of left knee 06/27/2020   Dilated cardiomyopathy (HCC) 07/18/2018   Chronic pain of left knee 12/04/2017   Venous insufficiency of both lower extremities 12/04/2017   Asthma 11/16/2017   Glaucoma 11/16/2017   Mixed hyperlipidemia 11/16/2017   Bradycardia 05/17/2017   S/P lumbar fusion 12/01/2016   Mild aortic valve stenosis 08/30/2016   Prolapse of anterior vaginal wall 12/02/2015   Bilateral carotid artery stenosis 04/13/2015   Spondylolisthesis of lumbar region 10/28/2014   Moderate mitral insufficiency 08/18/2014   Benign essential hypertension 06/22/2014   Lumbar stenosis 06/12/2014   Peripheral vascular disease 10/22/2013   Sciatica 10/17/2012   Acute on chronic diastolic heart failure (HCC) 08/08/2010   Cerebral artery occlusion with cerebral infarction (HCC) 08/06/2010    Past Surgical History:  Procedure Laterality Date   ABDOMINAL HYSTERECTOMY     BACK SURGERY     CATARACT EXTRACTION W/PHACO Left 10/20/2019   Procedure: CATARACT EXTRACTION PHACO AND INTRAOCULAR  LENS PLACEMENT (IOC) LEFT ISTENT INJ 7.13  00:42.2;  Surgeon: Myrna Adine Anes, MD;  Location: [facility];  Service: Ophthalmology;  Laterality: Left;   CATARACT EXTRACTION W/PHACO Right 11/10/2019   Procedure: CATARACT EXTRACTION PHACO AND INTRAOCULAR LENS PLACEMENT (IOC) RIGHT;  Surgeon: Myrna Adine Anes, MD;  Location: [facility];  Service: Ophthalmology;  Laterality: Right;  5.52 0:44.0   TONSILLECTOMY     TUBAL LIGATION      OB History   No obstetric history on file.      Home Medications    Prior to Admission medications  Medication Sig Start Date End Date Taking? Authorizing Provider  chlorthalidone (HYGROTON) 25 MG tablet Take 1 tablet by mouth daily. 02/13/20  Yes [provider]  ezetimibe (ZETIA) 10 MG tablet Take 1 tablet by mouth daily.   Yes [provider]  losartan (COZAAR) 50 MG tablet Take 50 mg by mouth daily.   Yes [provider]  oseltamivir  (TAMIFLU ) 30 MG capsule Take 1 capsule (30 mg total) by mouth 2 (two) times daily for 5 days. 02/16/24 02/21/24 Yes Danyella Mcginty, DO  albuterol  (VENTOLIN  HFA) 108 (90 Base) MCG/ACT inhaler Inhale 1-2 puffs into the lungs every 4 (four) hours as needed for wheezing or shortness of breath. 02/19/20 10/30/22  Arvis Huxley B, PA-C  alum, ammonium (ALUM) powder Apply topically 2 (two) times daily as needed. 10/04/22   Blitch, Marval HERO, NP  Ascorbic Acid (VITAMIN C)  1000 MG tablet Take 1,000 mg by mouth daily.    [provider]  aspirin 81 MG tablet Take 81 mg by mouth daily.    [provider]  azithromycin  (ZITHROMAX ) 250 MG tablet Take 1 tablet (250 mg total) by mouth daily. Take first 2 tablets together, then 1 every day until finished. 02/19/20   Arvis Huxley B, PA-C  beta carotene 25000 UNIT capsule 1 capsule DAILY (route: oral) 02/06/22   [provider]  Biotin 5000 MCG TABS Take by mouth daily.    [provider]  calcium-vitamin D (OSCAL WITH D)  500-200 MG-UNIT tablet Take 1 tablet by mouth daily with breakfast.    [provider]  carvedilol (COREG) 3.125 MG tablet Take 3.125 mg by mouth 2 (two) times daily with a meal.    [provider]  DORZOLAMIDE HCL-TIMOLOL MAL OP Apply to eye in the morning and at bedtime.    [provider]  famotidine (PEPCID) 20 MG tablet  06/15/22   [provider]  felodipine (PLENDIL) 2.5 MG 24 hr tablet Take 10 mg by mouth daily.     [provider]  fluticasone (FLONASE) 50 MCG/ACT nasal spray Place 2 sprays into both nostrils daily.    [provider]  hydrochlorothiazide (HYDRODIURIL) 12.5 MG tablet Take 12.5 mg by mouth daily. 12/22/19   [provider]  hydrOXYzine  (ATARAX ) 25 MG tablet Take 1 tablet (25 mg total) by mouth every 6 (six) hours. 10/04/22   Blitch, Marval HERO, NP  ibuprofen (ADVIL) 400 MG tablet Take 400 mg by mouth every 6 (six) hours as needed.    [provider]  Magnesium 400 MG TABS Take by mouth daily.    [provider]  meclizine (ANTIVERT) 25 MG tablet Take 25 mg by mouth 3 (three) times daily as needed for dizziness.     [provider]  methylPREDNISolone  (MEDROL  DOSEPAK) 4 MG TBPK tablet Take according to the package insert. 10/17/23   Bernardino Ditch, NP  metroNIDAZOLE (METROGEL) 1 % gel Apply topically daily as needed.    [provider]  montelukast (SINGULAIR) 10 MG tablet Take 10 mg by mouth daily as needed.    [provider]  pravastatin (PRAVACHOL) 40 MG tablet Take 40 mg by mouth daily. 10/14/21   [provider]  PREMARIN vaginal cream Place vaginally. 01/26/20   [provider]  spironolactone (ALDACTONE) 25 MG tablet Take by mouth. 09/18/22 09/18/23  [provider]  telmisartan (MICARDIS) 80 MG tablet Take by mouth. 01/26/20 10/04/22  [provider]  tiZANidine (ZANAFLEX) 2 MG tablet TAKE 1 TABLET BY MOUTH AT BEDTIME AS NEEDED FOR  MUSCLE SPASM 01/24/19   [provider]  Travoprost, BAK Free, (TRAVATAN) 0.004 % SOLN ophthalmic solution 1 drop at bedtime.    [provider]  VITAMIN A PO Take by mouth daily.    [provider]  vitamin B-12 (CYANOCOBALAMIN) 1000 MCG tablet Take 1,000 mcg by mouth daily.    [provider]  VITAMIN D PO Take by mouth daily.    [provider]  vitamin E 180 MG (400 UNITS) capsule Take 400 Units by mouth daily.    [provider]    Family History Family History  Problem Relation Age of Onset   Hypertension Mother    Hypertension Father    Cancer Sister    Diabetes Sister    Heart attack Brother     Social History Social  History[1]   Allergies   Atorvastatin, Dust mite extract, Lanolin, Other, Pravastatin, Bee pollen, Gabapentin, Pollen extract, Spironolactone, and Tape   Review of Systems Review of Systems: negative unless otherwise stated in HPI.      Physical Exam Triage Vital Signs ED Triage Vitals [02/16/24 1556]  Encounter Vitals Group     BP      Girls Systolic BP Percentile      Girls Diastolic BP Percentile      Boys Systolic BP Percentile      Boys Diastolic BP Percentile      Pulse      Resp      Temp      Temp src      SpO2      Weight 162 lb (73.5 kg)     Height      Head Circumference      Peak Flow      Pain Score 5     Pain Loc      Pain Education      Exclude from Growth Chart    No data found.  Updated Vital Signs BP 112/74 (BP Location: Right Arm)   Pulse 100   Temp 99.5 F (37.5 C) (Oral)   Resp 16   Wt 73.5 kg   SpO2 95%   BMI 31.64 kg/m   Visual Acuity Right Eye Distance:   Left Eye Distance:   Bilateral Distance:    Right Eye Near:   Left Eye Near:    Bilateral Near:     Physical Exam GEN:     alert, non-toxic appearing elderly female in no distress    HENT:  mucus membranes moist, oropharyngeal without lesions or erythema, no tonsillar hypertrophy or  exudates, no nasal discharge EYES:   no scleral injection or discharge RESP:  no increased work of breathing, clear to auscultation bilaterally CVS:   regular rate and rhythm ABD:   Soft, generalized tenderness, nondistended, no guarding, no rebound, active bowel sounds throughout, negative McBurney's, negative Murphy's Skin:   warm and dry    UC Treatments / Results  Labs (all labs ordered are listed, but only abnormal results are displayed) Labs Reviewed  POC COVID19/FLU A&B COMBO - Abnormal; Notable for the following components:      Result Value   Influenza A Antigen, POC Positive (*)    All other components within normal limits    EKG   Radiology No results found.   Procedures Procedures (including critical care time)  Medications Ordered in UC Medications  acetaminophen  (TYLENOL ) tablet 975 mg (975 mg Oral Given 02/16/24 1613)    Initial Impression / Assessment and Plan / UC Course  I have reviewed the triage vital signs and the nursing notes.  Pertinent labs & imaging results that were available during my care of the patient were reviewed by me and considered in my medical decision making (see chart for details).       Pt is a 84 y.o. female who presents for 1-2 days of respiratory symptoms. Aries is afebrile here without recent antipyretics. Satting well on room air. Overall pt is non-toxic appearing, well hydrated, without respiratory distress. Pulmonary exam is unremarkable.  POC COVID and influenza panel obtained and was positive for influenza A. Tamiflu  prescribed in renal dosing. Discussed symptomatic treatment. Typical duration of symptoms discussed.   Return and ED precautions given and voiced understanding. Discussed MDM, treatment plan and plan for follow-up with patient who agrees with plan.  Final Clinical Impressions(s) / UC Diagnoses   Final diagnoses:  Influenza-like illness  Influenza A  Diarrhea of presumed infectious origin      Discharge Instructions      You have influenza A.  Tamiflu  was prescribed.  Your symptoms will gradually improve over the next 7 to 10 days.  The cough may last about 3 weeks.   Take ibuprofen 600 mg with Tylenol  1000 mg for fever, headache or body aches.   For cough:  You can also use guaifenesin  and dextromethorphan for cough. You can use a humidifier for chest congestion and cough.  If you don't have a humidifier, you can sit in the bathroom with the hot shower running.      For sore throat: try warm salt water gargles, Mucinex  sore throat cough drops or cepacol lozenges, throat spray, warm tea or water with lemon/honey, popsicles or ice, or OTC cold relief medicine for throat discomfort. You can also purchase chloraseptic spray at the pharmacy or dollar store.   For congestion: take a daily anti-histamine like Zyrtec, Claritin, and a oral decongestant, such as pseudoephedrine.  You can also use Flonase 1-2 sprays in each nostril daily.    It is important to stay hydrated: drink plenty of fluids (water, gatorade/powerade/pedialyte, juices, or teas) to keep your throat moisturized and help further relieve irritation/discomfort.    Return or go to the Emergency Department if symptoms worsen or do not improve in the next few days      ED Prescriptions     Medication Sig Dispense Auth. Provider   oseltamivir  (TAMIFLU ) 30 MG capsule Take 1 capsule (30 mg total) by mouth 2 (two) times daily for 5 days. 10 capsule Rhyann Berton, DO      PDMP not reviewed this encounter.     [1]  Social History Tobacco Use   Smoking status: Former   Smokeless tobacco: Never   Tobacco comments:    Socially as teenager  Vaping Use   Vaping status: Never Used  Substance Use Topics   Alcohol use: No   Drug use: No     Larna Capelle, DO 02/17/24 9176  "

## 2024-02-16 NOTE — Discharge Instructions (Addendum)
 You have influenza A.  Tamiflu  was prescribed.  Your symptoms will gradually improve over the next 7 to 10 days.  The cough may last about 3 weeks.   Take ibuprofen 600 mg with Tylenol  1000 mg for fever, headache or body aches.   For cough:  You can also use guaifenesin  and dextromethorphan for cough. You can use a humidifier for chest congestion and cough.  If you don't have a humidifier, you can sit in the bathroom with the hot shower running.      For sore throat: try warm salt water gargles, Mucinex  sore throat cough drops or cepacol lozenges, throat spray, warm tea or water with lemon/honey, popsicles or ice, or OTC cold relief medicine for throat discomfort. You can also purchase chloraseptic spray at the pharmacy or dollar store.   For congestion: take a daily anti-histamine like Zyrtec, Claritin, and a oral decongestant, such as pseudoephedrine.  You can also use Flonase 1-2 sprays in each nostril daily.    It is important to stay hydrated: drink plenty of fluids (water, gatorade/powerade/pedialyte, juices, or teas) to keep your throat moisturized and help further relieve irritation/discomfort.    Return or go to the Emergency Department if symptoms worsen or do not improve in the next few days
